# Patient Record
Sex: Female | Born: 1965 | Hispanic: No | Marital: Married | Smoking: Never smoker
Health system: Southern US, Community
[De-identification: ages and names within clinical notes are randomized; demographics above are authoritative.]

## PROBLEM LIST (undated history)

## (undated) DIAGNOSIS — D219 Benign neoplasm of connective and other soft tissue, unspecified: Secondary | ICD-10-CM

## (undated) HISTORY — PX: ABDOMINAL HYSTERECTOMY: SHX81

## (undated) HISTORY — DX: Benign neoplasm of connective and other soft tissue, unspecified: D21.9

## (undated) HISTORY — PX: KNEE SURGERY: SHX244

## (undated) HISTORY — PX: ANKLE FRACTURE SURGERY: SHX122

## (undated) HISTORY — PX: FOOT SURGERY: SHX648

---

## 1997-11-08 ENCOUNTER — Inpatient Hospital Stay (HOSPITAL_COMMUNITY): Admission: AD | Admit: 1997-11-08 | Discharge: 1997-11-08 | Payer: Self-pay | Admitting: Obstetrics & Gynecology

## 2000-11-01 ENCOUNTER — Ambulatory Visit (HOSPITAL_COMMUNITY): Admission: RE | Admit: 2000-11-01 | Discharge: 2000-11-01 | Payer: Self-pay | Admitting: Pulmonary Disease

## 2000-11-01 ENCOUNTER — Encounter: Payer: Self-pay | Admitting: Pulmonary Disease

## 2009-10-18 ENCOUNTER — Emergency Department (HOSPITAL_BASED_OUTPATIENT_CLINIC_OR_DEPARTMENT_OTHER): Admission: EM | Admit: 2009-10-18 | Discharge: 2009-10-18 | Payer: Self-pay | Admitting: Emergency Medicine

## 2009-10-18 ENCOUNTER — Ambulatory Visit: Payer: Self-pay | Admitting: Diagnostic Radiology

## 2009-10-26 ENCOUNTER — Ambulatory Visit (HOSPITAL_COMMUNITY): Admission: RE | Admit: 2009-10-26 | Discharge: 2009-10-27 | Payer: Self-pay | Admitting: Orthopaedic Surgery

## 2010-09-14 LAB — BASIC METABOLIC PANEL
BUN: 10 mg/dL (ref 6–23)
BUN: 8 mg/dL (ref 6–23)
CO2: 24 mEq/L (ref 19–32)
CO2: 26 mEq/L (ref 19–32)
Calcium: 9.1 mg/dL (ref 8.4–10.5)
Calcium: 9.1 mg/dL (ref 8.4–10.5)
Chloride: 107 mEq/L (ref 96–112)
Chloride: 104 mEq/L (ref 96–112)
Creatinine, Ser: 0.65 mg/dL (ref 0.4–1.2)
Creatinine, Ser: 0.6 mg/dL (ref 0.4–1.2)
GFR calc Af Amer: >60 mL/min (ref >60)
GFR calc Af Amer: >60 mL/min (ref >60)
GFR calc non Af Amer: >60 mL/min (ref >60)
GFR calc non Af Amer: >60 mL/min (ref >60)
Glucose, Bld: 95 mg/dL (ref 70–99)
Glucose, Bld: 103 mg/dL — ABNORMAL HIGH (ref 70–99)
Potassium: 4.3 mEq/L (ref 3.5–5.1)
Potassium: 4.2 mEq/L (ref 3.5–5.1)
Sodium: 137 mEq/L (ref 135–145)
Sodium: 133 mEq/L — ABNORMAL LOW (ref 135–145)

## 2010-09-14 LAB — CBC
HCT: 34.4 % — ABNORMAL LOW (ref 36.0–46.0)
Hemoglobin: 12.1 g/dL (ref 12.0–15.0)
MCHC: 35.2 g/dL (ref 30.0–36.0)
MCV: 89.1 fL (ref 78.0–100.0)
Platelets: 253 10*3/uL (ref 150–400)
RBC: 3.86 MIL/uL — ABNORMAL LOW (ref 3.87–5.11)
RDW: 13.3 % (ref 11.5–15.5)
WBC: 9.1 10*3/uL (ref 4.0–10.5)

## 2014-02-17 ENCOUNTER — Ambulatory Visit (INDEPENDENT_AMBULATORY_CARE_PROVIDER_SITE_OTHER): Payer: Self-pay | Admitting: Emergency Medicine

## 2014-02-17 VITALS — BP 122/78 | HR 92 | Temp 98.4°F | Resp 20 | Ht 62.0 in | Wt 181.1 lb

## 2014-02-17 DIAGNOSIS — R1310 Dysphagia, unspecified: Secondary | ICD-10-CM

## 2014-02-17 NOTE — Progress Notes (Signed)
[facility] [address], [place] [address] [phone]  Date:  02/17/2014   Name:  Jeanette Cooper   DOB:  May 15, 1966   MRN:  [id number]  PCP:  No PCP Per Patient    Chief Complaint: Trouble Swallowing and Dry Mouth   History of Present Illness:  Jeanette Cooper is a 48 y.o. 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 Has been present for years.  Has been treated with "shots for a year" with no improvement.  No fever but chills.  Says she cannot eat.  Hurts to swallow.   No nausea or vomiting.  No cough or coryza.  No wheezing or shortness of breath.  No rash No improvement with over the counter medications or other home remedies.  Denies other complaint or health concern today.   There are no active problems to display for this patient.   No past medical history on file.  No past surgical history on file.  History  Substance Use Topics  . Smoking status: Never Smoker   . Smokeless tobacco: Never Used  . Alcohol Use: No    No family history on file.  Allergies not on file  Medication list has been reviewed and updated.  No current outpatient prescriptions on file prior to visit.   No current facility-administered medications on file prior to visit.    Review of Systems:  As per HPI, otherwise negative.    Physical Examination: Filed Vitals:   02/17/14 2102  BP: 122/78  Pulse: 92  Temp: 98.4 F (36.9 C)  Resp: 20   Filed Vitals:   02/17/14 2102  Height: 5\' 2"  (1.575 m)  Weight: 181 lb 2 oz (82.158 kg)   Body mass index is 33.12 kg/(m^2). Ideal Body Weight: Weight in (lb) to have BMI = 25: 136.4  GEN: WDWN, NAD, Non-toxic, A & O x 3 HEENT: Atraumatic, Normocephalic. Neck supple. No masses, No LAD. Ears and Nose: No external deformity. CV: RRR, No M/G/R. No JVD. No thrill. No extra heart sounds. PULM: CTA B, no wheezes, crackles,  rhonchi. No retractions. No resp. distress. No accessory muscle use. ABD: S, NT, ND, +BS. No rebound. No HSM. EXTR: No c/c/e NEURO Normal gait.  PSYCH: Normally interactive. Conversant. Not depressed or anxious appearing.  Calm demeanor.    Assessment and Plan: Pharyngitis ENT consult  Signed,  Ellison Carwin, MD

## 2014-02-17 NOTE — Patient Instructions (Signed)
Disfagia (Dysphagia) La dificultad para tragar (disfagia) ocurre cuando los slidos y los lquidos parecen adherirse a Patent examiner en su paso hacia el [place], o los alimentos demoran mucho en llegar al [employer]. Otros sntomas son regurgitacin de la comida, ruidos que provienen de la garganta, molestias en el pecho al tragar y sensacin de plenitud o de que hay algo adherido en la garganta al tragar. Cuando la obstruccin en la garganta es completa puede asociarse a babeo. CAUSAS  Los problemas para tragar pueden ocurrir debido a [employer]. Los alimentos no pueden ser [employer] de la manera habitual hacia el [place]. Puede tener lceras, tejido cicatrizal o inflamacin en el tubo por el que los alimentos descienden desde la boca al estmago (esfago), lo que obstruye a los alimentos en su paso normal hacia el [place]. Las causas de la inflamacin incluyen:  Reflujo cido desde el estmago hacia el esfago.  Infeccin.  Tratamiento de radiacin para Science writer.  Medicamentos que se toman sin la cantidad suficiente de lquido para IT sales professional. Puede ser que tenga problemas neurolgicos que impidan que las seales se enven a los msculos del esfago para que se contraigan y [place] la comida [place], [place]. Un globo farngeo es un problema relativamente frecuente en el que hay una sensacin de obstruccin o dificultad para tragar, sin que haya ninguna anormalidad fsica en los conductos que intervienen en la deglucin. Este problema generalmente mejora con el tiempo, al reasegurar y [place] estudios para descartar otras causas. DIAGNSTICO La disfagia puede diagnosticarse y su causa puede determinarse con estudios en los que deber tragar una sustancia blanca que ayuda visualizar el interior de la garganta (medio de contraste) mientras se toman radiografas. En algunos casos se inserta un telescopio flexible (endoscopio) para observar el esfago y Copywriter, advertising. TRATAMIENTO   Si la causa de la disfagia es el reflujo cido o una infeccin podrn indicarle medicamentos.  Si la causa de la disfagia son problemas en los msculos que intervienen en la deglucin, ser necesario seguir una terapia para fortalecer estos msculos.  Si la causa es una obstruccin o un bulto, se realizarn procedimientos para remover la obstruccin. INSTRUCCIONES PARA EL CUIDADO EN EL HOGAR  Trate de consumir alimentos que sean fciles de tragar y trate de controlar su peso diariamente para asegurarse de que no baje demasiado.  Asegrese de beber lquidos mientras se encuentre sentado erguido (no acostado). SOLICITE ATENCIN MDICA SI:  Pierde peso debido a que no puede tragar.  Tose al beber lquidos (aspiracin).  Tose y elimina comida parcialmente digerida. SOLICITE ATENCIN MDICA DE INMEDIATO SI:  No puede tragar su propia saliva.  Tiene dificultad para respirar, tiene fiebre o ambos.    Tiene ronquera junto a la dificultad para tragar. ASEGRESE DE QUE:  Comprende estas instrucciones.  Controlar su afeccin.  Recibir ayuda de inmediato si no mejora o si empeora. Document Released: 03/23/2005 Document Revised: 02/13/2013 [facility] Patient Information 2015 [place]. This information is not intended to replace advice given to you by your health care provider. Make sure you discuss any questions you have with your health care provider.

## 2014-02-18 ENCOUNTER — Emergency Department (HOSPITAL_BASED_OUTPATIENT_CLINIC_OR_DEPARTMENT_OTHER): Payer: Worker's Compensation

## 2014-02-18 ENCOUNTER — Encounter (HOSPITAL_BASED_OUTPATIENT_CLINIC_OR_DEPARTMENT_OTHER): Payer: Self-pay | Admitting: Emergency Medicine

## 2014-02-18 ENCOUNTER — Emergency Department (HOSPITAL_BASED_OUTPATIENT_CLINIC_OR_DEPARTMENT_OTHER)
Admission: EM | Admit: 2014-02-18 | Discharge: 2014-02-18 | Disposition: A | Discharge status: Home / Self Care | Payer: Worker's Compensation | Attending: Emergency Medicine | Admitting: Emergency Medicine

## 2014-02-18 ENCOUNTER — Telehealth: Payer: Self-pay

## 2014-02-18 DIAGNOSIS — Y929 Unspecified place or not applicable: Secondary | ICD-10-CM | POA: Insufficient documentation

## 2014-02-18 DIAGNOSIS — W11XXXA Fall on and from ladder, initial encounter: Secondary | ICD-10-CM | POA: Insufficient documentation

## 2014-02-18 DIAGNOSIS — S99919A Unspecified injury of unspecified ankle, initial encounter: Secondary | ICD-10-CM

## 2014-02-18 DIAGNOSIS — M25469 Effusion, unspecified knee: Secondary | ICD-10-CM | POA: Insufficient documentation

## 2014-02-18 DIAGNOSIS — S8990XA Unspecified injury of unspecified lower leg, initial encounter: Secondary | ICD-10-CM | POA: Insufficient documentation

## 2014-02-18 DIAGNOSIS — S8000XA Contusion of unspecified knee, initial encounter: Secondary | ICD-10-CM | POA: Diagnosis not present

## 2014-02-18 DIAGNOSIS — S99929A Unspecified injury of unspecified foot, initial encounter: Secondary | ICD-10-CM | POA: Diagnosis present

## 2014-02-18 DIAGNOSIS — Y939 Activity, unspecified: Secondary | ICD-10-CM | POA: Diagnosis not present

## 2014-02-18 DIAGNOSIS — S82899A Other fracture of unspecified lower leg, initial encounter for closed fracture: Secondary | ICD-10-CM | POA: Insufficient documentation

## 2014-02-18 DIAGNOSIS — S82891A Other fracture of right lower leg, initial encounter for closed fracture: Secondary | ICD-10-CM

## 2014-02-18 DIAGNOSIS — M25462 Effusion, left knee: Secondary | ICD-10-CM

## 2014-02-18 MED ORDER — ONDANSETRON HCL 4 MG PO TABS: 4.0000 mg | ORAL_TABLET | Freq: Four times a day (QID) | ORAL | Status: DC

## 2014-02-18 MED ORDER — ONDANSETRON HCL 8 MG PO TABS: 4.0000 mg | ORAL_TABLET | Freq: Once | ORAL | Status: DC

## 2014-02-18 MED ORDER — ONDANSETRON 4 MG PO TBDP
4.0000 mg | ORAL_TABLET | Freq: Once | ORAL | Status: AC
  Administered 2014-02-18: 4 mg via ORAL
  Filled 2014-02-18: qty 1

## 2014-02-18 MED ORDER — OXYCODONE-ACETAMINOPHEN 5-325 MG PO TABS
1.0000 | ORAL_TABLET | Freq: Once | ORAL | Status: AC
  Administered 2014-02-18: 2 via ORAL
  Filled 2014-02-18: qty 2

## 2014-02-18 MED ORDER — OXYCODONE-ACETAMINOPHEN 5-325 MG PO TABS: 1.0000 | ORAL_TABLET | Freq: Four times a day (QID) | ORAL | Status: DC | PRN

## 2014-02-18 NOTE — Telephone Encounter (Signed)
Patient is now requesting antibiotics that she had turned down from Dr. Ouida Sills from her visit yesterday- she thought she was leaving to [place] to get cheaper rx and to see her own doctor in [place]. She says she is feeling worse and thought that she would be seen today by an ENT dr. She is now requesting the medication Dr. Ouida Sills was going to prescribe. Please advise.   Pharmacy: [id number] [place], [place]

## 2014-02-18 NOTE — ED Notes (Signed)
Pt. Was on a 70ft ladder and fell down the ladder while cleaning a high window.  Pt. Reports R ankle and R foot pain ( pt. Reports breaking the R ankle three yrs ago).  Pt. Has reports of the L knee hurting.  Pt. Also complains of of Back pain.

## 2014-02-18 NOTE — Discharge Instructions (Signed)
Please follow the instructions included.  You have a small, fracture on you rt ankle that looks new, but it is not out of place and you can bear weight on that foot.  You do not have a fracture in your knee but there is fluid on your knee that may mean there is an injury to the other structures in your knee.  It is very important to follow up with your ortho (bone) doctor this week.  You have been given prescriptions for bad pain, but you may also take ibuprofen 600 mg every 6-8 hrs for less pain. A knee immobilizer was placed to rest and protect your injury. Try to keep your splint clean and dry.   Call or return immediately if you have: Increased pain or pressure around the injury.  Numbness, tingling, painful, cool toes or fingers.  Swelling or inability to move fingers or toes.  Color change to fingers or toes (blue or gray).

## 2014-02-18 NOTE — Telephone Encounter (Signed)
Pt was referred to ENT at [place]. No mention of prescribing medication for dysphagia. Please advise.

## 2014-02-18 NOTE — ED Notes (Signed)
Pt reports still feeling dizzy but nausea has passed since she is drinking Ginger Ale.

## 2014-02-18 NOTE — ED Provider Notes (Signed)
CSN: [id number]     Arrival date & time 02/18/14  1534 History   First MD Initiated Contact with Patient 02/18/14 1553     Chief Complaint  Patient presents with  . Fall   Patient is a 48 y.o. female presenting with fall. The history is provided by a relative. The history is limited by a language barrier. A language interpreter was used.  Fall This is a new problem. Episode onset: Just PTA. Associated symptoms include arthralgias, joint swelling and myalgias. Pertinent negatives include no chest pain, chills, coughing, fever, headaches, nausea, neck pain, numbness, rash, sore throat, vomiting or weakness.   Jeanette Cooper is 48 yo female with PMH of ankle surgery, presenting to the ED with c/o rt ankle, and bilat knee pain since fall, 1 hour prior to arrival. She was near the top of a 20 foot ladder, the ladder fell over and she came down with the ladder, landing on her right side.  She reports not being able to bear weight on her left leg.  She denies hitting her head, syncopal episodes, n/v, neck pain, numbness, tingling or alterartons in sensation.    History reviewed. No pertinent past medical history. Past Surgical History  Procedure Laterality Date  . Ankle fracture surgery     No family history on file. History  Substance Use Topics  . Smoking status: Never Smoker   . Smokeless tobacco: Never Used  . Alcohol Use: No   OB History   Grav Para Term Preterm Abortions TAB SAB Ect Mult Living                 Review of Systems  Constitutional: Negative for fever and chills.  HENT: Negative for sore throat.   Eyes: Negative for visual disturbance.  Respiratory: Negative for cough and shortness of breath.   Cardiovascular: Negative for chest pain and leg swelling.  Gastrointestinal: Negative for nausea, vomiting and diarrhea.  Genitourinary: Negative for dysuria.  Musculoskeletal: Positive for arthralgias, gait problem, joint swelling and myalgias. Negative for neck pain and neck  stiffness.  Skin: Negative for rash.  Neurological: Negative for syncope, weakness, numbness and headaches.      Allergies  Hydrocodone  Home Medications   Prior to Admission medications   Not on File   BP 128/71  Pulse 82  Temp(Src) 98.4 F (36.9 C) (Oral)  Resp 17  Ht 5\' 2"  (1.575 m)  Wt 180 lb (81.647 kg)  BMI 32.91 kg/m2  SpO2 99% Physical Exam  Nursing note and vitals reviewed. Constitutional: She is oriented to person, place, and time. She appears well-developed and well-nourished. No distress.  HENT:  Head: Normocephalic and atraumatic.  Mouth/Throat: Oropharynx is clear and moist. No oropharyngeal exudate.  Eyes: Conjunctivae are normal.  Neck: Neck supple. No thyromegaly present.  Cardiovascular: Normal rate, regular rhythm and intact distal pulses.   Pulmonary/Chest: Effort normal and breath sounds normal. No respiratory distress. She has no wheezes. She has no rales. She exhibits no tenderness.  Abdominal: Soft. There is no tenderness.  Musculoskeletal: She exhibits tenderness.       Right knee: She exhibits swelling and ecchymosis. She exhibits normal range of motion. Tenderness found.       Left knee: She exhibits decreased range of motion and swelling. She exhibits no LCL laxity, no bony tenderness and no MCL laxity. Tenderness found.       Right ankle: She exhibits normal range of motion, no swelling, no deformity and normal pulse. Tenderness.  Lymphadenopathy:    She has no cervical adenopathy.  Neurological: She is alert and oriented to person, place, and time. No cranial nerve deficit or sensory deficit. Coordination normal. GCS eye subscore is 4. GCS verbal subscore is 5. GCS motor subscore is 6.  Skin: Skin is warm and dry. Ecchymosis noted. No rash noted. She is not diaphoretic.     Psychiatric: She has a normal mood and affect.    ED Course  Procedures (including critical care time) Labs Review Labs Reviewed - No data to display  Imaging  Review Dg Ankle Complete Right  02/18/2014   CLINICAL DATA:  [place], pain  EXAM: RIGHT ANKLE - COMPLETE 3+ VIEW  COMPARISON:  10/18/2009  FINDINGS: The patient has undergone previous repair of a bimalleolar fracture. The fractures have healed. The hardware remains. There is slight widening of the ankle mortise medially which could be acute or chronic. There may be an acute injury of the medial malleolus with a slight oblique lucency below the hardware (arrow). There is a subcentimeter sized inframalleolar osseous density on the LEFT which is probably chronic, unlikely to represent an acute avulsion fragment. The talus appears intact. There is no calcaneal fracture. Mild anterior and medial soft tissue swelling.  IMPRESSION: Slight widening of the ankle mortise medially which could be acute or chronic. Remote ORIF ankle fractures.  Possible acute injury of the medial malleolus seen best on the oblique view, arrow.  Subcentimeter sized inframalleolar osseous density on the LEFT which is probably chronic.   Electronically Signed   By: Rolla Flatten M.D.   On: 02/18/2014 16:51   Dg Knee Complete 4 Views Left  02/18/2014   CLINICAL DATA:  [place] approximately 20 feet from a ladder earlier today, injuring the left knee.  EXAM: LEFT KNEE - COMPLETE 4+ VIEW  COMPARISON:  None.  FINDINGS: No evidence of acute fracture or dislocation. Well-preserved joint spaces. Well-preserved bone mineral density. No intrinsic osseous abnormality. Small to moderate-sized joint effusion.  IMPRESSION: No osseous abnormality.  Small to moderate-sized joint effusion.   Electronically Signed   By: Evangeline Dakin M.D.   On: 02/18/2014 16:48   Dg Knee Complete 4 Views Right  02/18/2014   CLINICAL DATA:  Fall, right knee pain/injury  EXAM: RIGHT KNEE - COMPLETE 4+ VIEW  COMPARISON:  None.  FINDINGS: No fracture or dislocation is seen.  The joint spaces are preserved.  The visualized soft tissues are unremarkable.  IMPRESSION: No fracture or  dislocation is seen.   Electronically Signed   By: Julian Hy M.D.   On: 02/18/2014 16:50     EKG Interpretation None      MDM   Final diagnoses:  Fall from ladder, initial encounter  Knee effusion, left  Closed right ankle fracture, initial encounter   Pt is 48 yo female presents after fall from ladder, when ladder fell.  X-Ray shows left knee effusion and small non-displaced Rt malleolus fracture.  Pain managed in ED. Pt is able to bear weight on rt leg but is non weight bearing on left.  Lt knee immobilizer placed and crutches given. Pt advised to follow up with orthopedics. Conservative therapy recommended and discussed. Patient will be dc home & is agreeable with above plan.  Return precautions given.   Meds given in ED:  Medications  oxyCODONE-acetaminophen (PERCOCET/ROXICET) 5-325 MG per tablet 1-2 tablet (2 tablets Oral Given 02/18/14 1623)  ondansetron (ZOFRAN-ODT) disintegrating tablet 4 mg (4 mg Oral Given 02/18/14 1623)  Discharge Medication List as of 02/18/2014  6:09 PM         Britt Bottom, NP 02/18/14 1859

## 2014-02-18 NOTE — ED Notes (Signed)
Pt rx x 2 were picked up by [facility] pharmacy-now closed-EDPA notified and rx x 2 reprinted-also request to have d/c instructions in spanish-done

## 2014-02-19 NOTE — Telephone Encounter (Signed)
No just have her see the ENT

## 2014-02-19 NOTE — ED Provider Notes (Signed)
Medical screening examination/treatment/procedure(s) were performed by non-physician practitioner and as supervising physician I was immediately available for consultation/collaboration.   EKG Interpretation None        Fredia Sorrow, MD 02/19/14 1615

## 2014-02-19 NOTE — Telephone Encounter (Signed)
Pt understands no medication and will be going to a specialist.

## 2016-03-17 ENCOUNTER — Encounter: Payer: Self-pay | Admitting: Gynecology

## 2016-03-17 ENCOUNTER — Ambulatory Visit (INDEPENDENT_AMBULATORY_CARE_PROVIDER_SITE_OTHER): Payer: BLUE CROSS/BLUE SHIELD | Admitting: Gynecology

## 2016-03-17 VITALS — BP 116/78 | Ht 61.5 in | Wt 181.0 lb

## 2016-03-17 DIAGNOSIS — R102 Pelvic and perineal pain: Secondary | ICD-10-CM | POA: Diagnosis not present

## 2016-03-17 DIAGNOSIS — R232 Flushing: Secondary | ICD-10-CM

## 2016-03-17 DIAGNOSIS — Z01419 Encounter for gynecological examination (general) (routine) without abnormal findings: Secondary | ICD-10-CM | POA: Diagnosis not present

## 2016-03-17 DIAGNOSIS — N951 Menopausal and female climacteric states: Secondary | ICD-10-CM

## 2016-03-17 NOTE — Addendum Note (Signed)
Addended by: Thurnell Garbe A on: 03/17/2016 12:04 PM   Modules accepted: Orders

## 2016-03-17 NOTE — Progress Notes (Signed)
Jeanette Cooper 20-Oct-1965 [id number]   History:    50 y.o.  for annual gyn exam who is a new patient to the practice with the following complaints: Hot flashes, irritability, mood swing, vaginal dryness, and decreased libido. Patient stated many years ago in [place] she had total abdominal hysterectomy for fibroid uterus. She stated she had her recent blood work and mammogram done in [place] this year. She stated that her Pap smear and they did a colposcopy we have no report. Asian had someone else look at her Pap smear in [place] and she did not need a colposcopic evaluation or treatment. For this reason were going to repeat a Pap smear today. Patient also requesting a flu vaccine.   Patient with complaints also vault, discomfort on and off but no true dyspareunia.   Past medical history,surgical history, family history and social history were all reviewed and documented in the EPIC chart.  Gynecologic History No LMP recorded. Patient has had a hysterectomy. Contraception: status post hysterectomy Last Pap: In [place] 2017. Results were: Uncertain result? We have no report? Last mammogram: Done in [place] patient reported was normal. Results were: 2017  Obstetric History OB History  Gravida Para Term Preterm AB Living  3 3       3   SAB TAB Ectopic Multiple Live Births               # Outcome Date GA Lbr Len/2nd Weight Sex Delivery Anes PTL Lv  3 Para      Vag-Spont     2 Para      Vag-Spont     1 Para      Vag-Spont          ROS: A ROS was performed and pertinent positives and negatives are included in the history.  GENERAL: No fevers or chills. HEENT: No change in vision, no earache, sore throat or sinus congestion. NECK: No pain or stiffness. CARDIOVASCULAR: No chest pain or pressure. No palpitations. PULMONARY: No shortness of breath, cough or wheeze. GASTROINTESTINAL: No abdominal pain, nausea, vomiting or diarrhea, melena or bright red blood per rectum. GENITOURINARY: No  urinary frequency, urgency, hesitancy or dysuria. MUSCULOSKELETAL: No joint or muscle pain, no back pain, no recent trauma. DERMATOLOGIC: No rash, no itching, no lesions. ENDOCRINE: No polyuria, polydipsia, no heat or cold intolerance. No recent change in weight. HEMATOLOGICAL: No anemia or easy bruising or bleeding. NEUROLOGIC: No headache, seizures, numbness, tingling or weakness. PSYCHIATRIC: No depression, no loss of interest in normal activity or change in sleep pattern.     Exam: chaperone present  BP 116/78   Ht 5' 1.5" (1.562 m)   Wt 181 lb (82.1 kg)   BMI 33.65 kg/m   Body Cooper index is 33.65 kg/m.  General appearance : Well developed well nourished female. No acute distress HEENT: Eyes: no retinal hemorrhage or exudates,  Neck supple, trachea midline, no carotid bruits, no thyroidmegaly Lungs: Clear to auscultation, no rhonchi or wheezes, or rib retractions  Heart: Regular rate and rhythm, no murmurs or gallops Breast:Examined in sitting and supine position were symmetrical in appearance, no palpable masses or tenderness,  no skin retraction, no nipple inversion, no nipple discharge, no skin discoloration, no axillary or supraclavicular lymphadenopathy Abdomen: no palpable masses or tenderness, no rebound or guarding Extremities: no edema or skin discoloration or tenderness  Pelvic:  Bartholin, Urethra, Skene Glands: Within normal limits             Vagina:  No gross lesions or discharge  Cervix: Absent  Uterus  absent  Adnexa  Without masses or tenderness  Anus and perineum  normal   Rectovaginal  normal sphincter tone without palpated masses or tenderness             Hemoccult not indicated     Assessment/Plan:  50 y.o. female for annual exam will return back to the office in one week for pelvic ultrasound for better assessment of her ovaries. Although patient had a hysterectomy but there was uncertain interpretation of a Pap smear that was done this year in [place] I'm  going to repeat her Pap smear with HPV screening today. Also because of her vasomotor symptoms we're going to check her [facility]. She stated that her lipid profile and blood sugar was screening this year [place] was normal. Patient also requesting flu vaccine. Literature information Spanish on the menopause and hormone replacement therapy was provided.   Jeanette Mass MD, 11:44 AM 03/17/2016  Is a new patient to the practice. Patient with complaints

## 2016-03-17 NOTE — Patient Instructions (Signed)
Influenza Virus Vaccine (Flucelvax) Qu es este medicamento? La VACUNA ANTIGRIPAL ayuda a disminuir el riesgo de contraer la influenza, tambin conocida como la gripe. La vacuna solo ayuda a protegerle contra algunas cepas de influenza. Este medicamento puede ser utilizado para otros usos; si tiene alguna pregunta consulte con su proveedor de atencin mdica o con su farmacutico. Qu le debo informar a mi profesional de la salud antes de tomar este medicamento? Necesita saber si usted presenta alguno de los siguientes problemas o situaciones: -trastorno de sangrado como hemofilia -fiebre o infeccin -sndrome de Guillain-Barre u otros problemas neurolgicos -problemas del sistema inmunolgico -infeccin por el virus de la inmunodeficiencia humana (VIH) o SIDA -niveles bajos de plaquetas en la sangre -esclerosis mltiple -una reaccin alrgica o inusual a las vacunas antigripales, a otros medicamentos, alimentos, colorantes o conservantes -si est embarazada o buscando quedar embarazada -si est amamantando a un beb Cmo debo utilizar este medicamento? Esta vacuna se administra mediante inyeccin por va intramuscular. Lo administra un profesional de KB Home	[place]. Recibir una copia de informacin escrita sobre la vacuna antes de cada vacuna. Asegrese de leer este folleto cada vez cuidadosamente. Este folleto puede cambiar con frecuencia. Hable con su pediatra para informarse acerca del uso de este medicamento en nios. Puede requerir atencin especial. Sobredosis: Pngase en contacto inmediatamente con un centro toxicolgico o una sala de urgencia si usted cree que haya tomado demasiado medicamento. ATENCIN: [employer] es solo para usted. No comparta este medicamento con nadie. Qu sucede si me olvido de una dosis? No se aplica en este caso. Qu puede interactuar con este medicamento? -quimioterapia o radioterapia -medicamentos que suprimen el sistema inmunolgico, tales como  etanercept, anakinra, infliximab y adalimumab -medicamentos que tratan o previenen cogulos sanguneos, como warfarina -fenitona -medicamentos esteroideos, como la prednisona o la cortisona -teofilina -vacunas Puede ser que esta lista no menciona todas las posibles interacciones. Informe a su profesional de KB Home	[place] de [employer] productos a base de hierbas, medicamentos de [place] o suplementos nutritivos que est tomando. Si usted fuma, consume bebidas alcohlicas o si utiliza drogas ilegales, indqueselo tambin a su profesional de KB Home	[place]. Algunas sustancias pueden interactuar con su medicamento. A qu debo estar atento al usar [employer]? Informe a su mdico o a Barrister's clerk de la [employer] todos los efectos secundarios que persistan despus de 3 das. Llame a su proveedor de atencin mdica si se presentan sntomas inusuales dentro de las 6 semanas de recibir esta vacuna. Es posible que todava pueda contraer la gripe, pero la enfermedad no ser tan fuerte como normalmente. No puede contraer la gripe de esta vacuna. La vacuna antigripal no le protege contra resfros u otras enfermedades que pueden causar [place]. Debe vacunarse cada ao. Qu efectos secundarios puedo tener al [employer] este medicamento? Efectos secundarios que debe informar a su mdico o a Barrister's clerk de la salud tan pronto como sea posible: -reacciones alrgicas como erupcin cutnea, picazn o urticarias, hinchazn de la cara, labios o lengua Efectos secundarios que, por lo general, no requieren atencin mdica (debe informarlos a su mdico o a su profesional de la salud si persisten o si son molestos): -fiebre -dolor de cabeza -molestias y dolores musculares -dolor, sensibilidad, enrojecimiento o Estate agent de la inyeccin -cansancio Puede ser que esta lista no menciona todos los posibles efectos secundarios. Comunquese a su mdico por asesoramiento mdico [employer]. Usted  puede informar los efectos secundarios a la FDA por telfono al 1-800-FDA-1088. Dnde  debo guardar mi medicina? Esta vacuna se administrar por un profesional de la salud en una [place], Corporate investment banker, consultorio mdico u otro consultorio de un profesional de la salud. No se le suministrar esta vacuna para guardar en su domicilio. ATENCIN: Este folleto es un resumen. Puede ser que no cubra toda la posible informacin. Si usted tiene preguntas acerca de esta medicina, consulte con su mdico, su farmacutico o su profesional de Radiographer, therapeutic.    2016, Elsevier/Gold Standard. (2011-05-30 16:29:16) Luther Redo de reemplazo hormonal (Hormone Therapy) En la menopausia, su cuerpo comienza a producir menos estrgeno y Education officer, museum. Esto provoca que el cuerpo deje de tener perodos [place]. Esto se debe a que el estrgeno y la progesterona controlan sus perodos y su ciclo menstrual. Neomia Dear falta de estrgeno puede causar sntomas tales como:  Research scientist (life sciences).  Sequedad vaginal  Piel seca.  Prdida del deseo sexual.  Riesgo de prdida de hueso (osteoporosis). Cuando esto ocurre, puede elegir realizar Neomia Dear terapia hormonal para volver a Radiographer, therapeutic estrgeno perdido [employer]. Cuando slo se introduce esta hormona, el procedimiento se conoce normalmente como TRE (terapia de reemplazo de [place]). Cuando la hormona progestina se combina con el estrgeno, el procedimiento se conoce normalmente como TH (terapia hormonal). Esto es lo que previamente se conoca como terapia de reemplazo hormonal (TRH). El profesional que le asiste le ayudar a tomar una decisin acerca de lo que resulte lo mejor para usted. La decisin de realizar una TRH cambia a menudo debido a que se Engineer, agricultural. Muchos estudios no ponen de acuerdo con respecto a los beneficios de Education officer, environmental una terapia de reemplazo hormonal.  BENEFICIOS PROBABLES DE LA TRH QUE INCLUYEN PROTECCIN CONTRA:  Golpes de calor - Un golpe de calor es  la sensacin repentina de calor sobre la cara y el cuerpo. La piel enrojece, como al sonrojarse. Estn asociados con la transpiracin y los trastornos del sueo. Las mujeres que atraviesan la menopausia pueden tener golpes de calor unas pocas veces en el mes o varios al da; esto depende de la mujer.  Osteoporosis (prdida de hueso) - El estrgeno ayuda a protegerse contra la prdida de [place]. Luego de la [place], los huesos de una mujer pierden calcio y se vuelven frgiles y Air traffic controller. Como resultado, es ms probable que el hueso se [place]. Los que resultan afectados con mayor frecuencia son los de la cadera, la [place] y la columna vertebral. La terapia hormonal puede ayudar a retardar la prdida de hueso luego de la menopausia. Realizar ejercicios con peso y tomar calcio con vitamina D tambin puede ayudar a prevenir la prdida de [place]. Existen medicamentos que puede prescribir el profesional que la asiste para ayudar a prevenir la osteoporosis.  Sequedad vaginal - La prdida de estrgeno produce cambios en la vagina. El recubrimiento de la misma puede volverse fino y Nurse, learning disability. Estos cambios pueden causar dolor y sangrado durante las relaciones sexuales. La sequedad tambin puede producir una infeccin. Puede ocasionarle ardor y picazn.  Las infecciones en las vas urinarias son ms comunes luego de la menopausia debido a la falta de Astronomer.  Otros beneficios posibles del estrgeno incluyen un cambio positivo en el humor y en la memoria de corto plazo en las mujeres. EFECTOS SECUNDARIOS Y RIESGOS  Utilizar estrgeno slo sin progesterona causa que el recubrimiento del tero crezca. Esto aumenta el riesgo de cncer endometrial. El profesional que la asiste deber darle otra hormona llamada progestina, si usted tiene tero.  Las mujeres que [place]  combinada (estrgeno y progestina) parecen tener un mayor riesgo de sufrir cncer de mama. El riesgo parece ser [place], [place] aumenta a lo  largo del tiempo que se realice la [place].  La terapia combinada tambin hace que el tejido mamario sea levemente ms denso, lo que hace que sea ms difcil leer mamografas (radiografas de mama).  Combinada, la terapia de estrgeno y Immunologist puede realizarse todos los das, en cuyo caso podrn Warehouse manager de [place]. La TH puede realizarse de [place] cclica, en cuyo caso tendr perodos menstruales.  La TH puede aumentar el riesgo de [place], ataque cardaco, cncer de mama y formacin de cogulos en la pierna.  El estrgeno transdrmico (estrgeno que se absorbe a Designer, jewellery de la piel mediante el uso de un parche o una crema) puede tener mejores resultados en los siguientes casos:  Colesterol.  La presin arterial.  Cogulos sanguneos. La presencia de estas afecciones puede indicar que no debe realizarse terapia hormonal:  Cncer de endometrio.  Enfermedad heptica.  Cncer de mama.  Cardiopata.  Antecedentes de cogulos sanguneos.  Ictus. TRATAMIENTO  Si decide realizar Ardelia Mems TH y tiene tero, normalmente se prescribe el uso de estrgeno y progestina.  El profesional que la asiste le ayudar a decidir la mejor forma de [employer].  [employer] formas posibles de administracin de [place], se incluyen las siguientes:  Pldoras.  Parches.  Geles.  Aerosoles.  Crema, anillos y vulos vaginales con estrgeno.  Lo mejor es Chief Executive Officer dosis posible que pueda ayudarla con sus sntomas y tomarlos durante la menor cantidad de tiempo posible.  La terapia hormonal puede ayudar a Banker de los problemas (sntomas) que afectan a las mujeres durante la menopausia. Antes de tomar una decisin con respecto a la TH, converse con el profesional que la asiste acerca de qu es lo mejor para usted. Mantngase bien informada y sintase cmoda con sus decisiones. INSTRUCCIONES PARA EL CUIDADO DOMICILIARIO:  [place] indicaciones del profesional con respecto  a cmo Biomedical scientist.  Se realiza una prueba de Papanicolaou para detectar el cncer de cuello del tero.  El primer Papanicolaou debe realizarse a los 21 aos.  La prueba se repite cada 2 aos entre los 21 y los 29 aos.  Despus de los 30 aos, debe realizarse una prueba de Papanicolaou cada 3 aos siempre que los 3 estudios anteriores hayan sido normales.  Algunas mujeres presentan problemas mdicos que aumentan la probabilidad de Museum/gallery curator cncer de cuello del tero. Consulte a su mdico acerca de estos problemas. Es muy importante que le informe a su mdico si aparecen nuevos problemas poco despus de su ltimo Papanicolaou. En estos casos, el mdico podr indicar que se realice el Papanicolaou con ms frecuencia.  Las [employer] Company son las mismas para las mujeres que hayan recibido o no la vacuna para el VPH (virus del papiloma humano).  Si le han realizado una histerectoma por un problema que no era cncer o una afeccin que pudiera causar cncer, ya no necesitar un Papanicolaou. Sin embargo, aunque ya no necesite hacerse un Papanicolaou, es una buena idea hacerse un examen regularmente para asegurarse de que no haya otros problemas.  Si tiene entre 81 y 88 aos y ha tenido [employer] estudios de Papanicolaou en los ltimos 10 aos, ya no ser IT sales professional. Sin embargo, aunque ya no necesite hacerse un Papanicolaou, es una buena idea hacerse un examen regularmente para asegurarse de que no haya otros problemas.  Si ha recibido un tratamiento para Science writer cervical o una enfermedad que podra causar cncer, necesitar realizarse una prueba de Papanicolaou y controles durante al menos 48 aos de concluido el [place].  Si no se ha [place] [employer] con regularidad, Chief Executive Officer a [employer] factores de riesgo (como el tener un nuevo compaero sexual) para Teacher, adult education si es necesario que se los haga de [place].  Es posible que algunas  mujeres deban realizarse exmenes de deteccin con mayor frecuencia si presentan un alto riesgo de padecer cncer de cuello del tero.  Hgase controles de [place] regular, e incluya Papanicolau y [place]. SOLICITE ATENCIN MDICA DE INMEDIATO SI PRESENTA:  Hemorragia vaginal anormal.  Dolor o inflamacin en las piernas, falta de aliento o Tourist information centre manager.  Mareos o dolores de [place].  Protuberancias o cambios en sus mamas o axilas.  Pronunciacin inarticulada.  Debilidad o adormecimiento en los brazos o las piernas.  Dolor, ardor o sangrado al [employer].  Dolor abdominal.   Esta informacin no tiene como fin reemplazar el consejo del mdico. Asegrese de hacerle al mdico cualquier pregunta que tenga.   Document Released: 11/30/2007 Document Revised: 10/28/2014 Elsevier Interactive Patient Education 2016 [place] menopausia (Menopause) La menopausia es el perodo normal de la vida en el cual los perodos menstruales cesan por completo. Se completa cuando no se tienen 12 perodos menstruales consecutivos. Ocurre generalmente en mujeres entre los 40 y los 62 aos. Muy rara vez una mujer tiene la menopausia antes de los 40 [place]. En la menopausia, los ovarios dejan de producir las hormonas femeninas estrgeno y Immunologist. Esto puede causar sntomas indeseables y Technical brewer a Technical sales engineer. A veces los sntomas aparecen entre 4 y 5 aos antes de que comience la menopausia. No existe una relacin [employer] y:  [place] anticonceptivos orales.  La cantidad de hijos que tuvo.  La raza.  La edad en que comenzaron los perodos menstruales (menarca). Las mujeres que fuman mucho y las que son muy delgadas pueden desarrollar la menopausia a una edad ms temprana. CAUSAS  Los ovarios dejan de producir las hormonas femeninas estrgeno y Immunologist.  Otras causas son:  [place] en la que se extirpan ambos ovarios.  Los ovarios que dejan de funcionar sin un motivo  conocido.  Tumores de la glndula pituitaria en el cerebro.  Enfermedades que Continental Airlines ovarios y la produccin de hormonas.  Radioterapia en el abdomen o la pelvis.  Quimioterapia que afecte a los ovarios. SNTOMAS   Acaloramiento.  Sudoracin nocturna.  Disminucin del deseo sexual.  Sequedad vaginal y adelgazamiento de la vagina, lo que causa relaciones sexuales dolorosas.  Sequedad de la piel y aparicin de Glass blower/designer.  Dolores de [place].  Cansancio.  Irritabilidad.  Problemas de memoria.  Aumento de [place].  Infeccin de la vejiga.  Aparicin de vello en el rostro y [place].  Infertilidad. Los sntomas ms graves pueden incluir:  Prdida de masa sea osteoporosis) que favorece la rotura de huesos(fracturas).  Depresin.  Endurecimiento y Public librarian de las arterias (aterosclerosis) lo que puede causar infarto e ictus. DIAGNSTICO   El perodo menstrual no aparece por 12 meses seguidos.  Examen fsico.  Estudios hormonales de Herbalist. TRATAMIENTO  Hay muchas opciones de tratamiento y casi tantas dudas como opciones existen. La decisin de tratar o no los cambios que trae la menopausia es una decisin que realiza el profesional de acuerdo con cada Advertising copywriter. El Management consultant las opciones de [place]  con usted. Juntos podrn decidir qu tratamiento es el mejor para usted. Las opciones de tratamiento pueden incluir:   Terapia hormonal (estrgenos y Immunologist).  Medicamentos no hormonales.  Tratamiento de los sntomas individuales con medicamentos (por ejemplo antidepresivos para la depresin).  Algunos medicamentos herbales pueden ayudar en sntomas especficos.  Psicoterapia con un psiclogo o psiquiatra.  Terapia grupal.  Cambios en el estilo de vida, como:  Consumir una dieta saludable.  Actividad fsica regular.  Limitar el consumo de cafena y alcohol.  Control del estrs y Public affairs consultant.  No realizar  tratamiento. [place] todos los medicamentos como el mdico le indique.  Descanse y duerma lo suficiente.  Haga ejercicios regularmente.  Consuma una dieta que contenga calcio (bueno para los [place]) y productos derivados de la soja (actan como estrgenos).  Evite las bebidas alcohlicas.  No fume.  Si tiene sofocones, vstase en capas.  Tome suplementos de calcio y vitamina D para fortalecer los [place].  Puede utilizar lubricantes y humectantes de venta libre para la sequedad vaginal.  En algunos casos la terapia de grupo podr ayudarla.  La acupuntura puede ser de ayuda en ciertos casos. SOLICITE ATENCIN MDICA SI:   No est segura de estar en la menopausia.  Tiene sntomas de [place] y [place] asesoramiento y Clinical research associate.  Tiene 34 aos y an tiene Chartered certified accountant.  Tiene dolor durante las [employer].  La menopausia se ha completado (no ha tenido el perodo menstrual durante 12 meses) y tiene un sangrado vaginal.  Necesita ser derivada a un especialista (gineclogo, psiquiatra, o psiclogo) para Arts administrator. SOLICITE ATENCIN MDICA DE INMEDIATO SI:   Siente una depresin intensa e incontrolable.  Tiene una hemorragia vaginal abundante.  Siente y cree que se ha fracturado un hueso.  Siente dolor al [employer] Airlines.  Siente dolor en el pecho o en la pierna.  Tiene latidos cardacos rpidos (palpitaciones).  Siente dolor de cabeza intenso.  Tiene problemas de visin.  Siente un bulto en la mama.  Siente un dolor abdominal intenso o indigestin.   Esta informacin no tiene Marine scientist el consejo del mdico. Asegrese de hacerle al mdico cualquier pregunta que tenga.   Document Released: 07/25/2006 Document Revised: 02/13/2013 Elsevier Interactive Patient Education [employer].

## 2016-03-18 LAB — PAP, TP IMAGING W/ HPV RNA, RFLX HPV TYPE 16,18/45: HPV MRNA, HIGH RISK: NOT DETECTED

## 2016-03-18 LAB — FOLLICLE STIMULATING HORMONE: FSH: 66.2 m[IU]/mL

## 2016-04-01 ENCOUNTER — Ambulatory Visit (INDEPENDENT_AMBULATORY_CARE_PROVIDER_SITE_OTHER): Payer: BLUE CROSS/BLUE SHIELD | Admitting: Gynecology

## 2016-04-01 ENCOUNTER — Encounter: Payer: Self-pay | Admitting: Gynecology

## 2016-04-01 ENCOUNTER — Ambulatory Visit (INDEPENDENT_AMBULATORY_CARE_PROVIDER_SITE_OTHER): Payer: BLUE CROSS/BLUE SHIELD

## 2016-04-01 VITALS — BP 120/78

## 2016-04-01 DIAGNOSIS — R6882 Decreased libido: Secondary | ICD-10-CM | POA: Diagnosis not present

## 2016-04-01 DIAGNOSIS — F39 Unspecified mood [affective] disorder: Secondary | ICD-10-CM | POA: Diagnosis not present

## 2016-04-01 DIAGNOSIS — R454 Irritability and anger: Secondary | ICD-10-CM

## 2016-04-01 DIAGNOSIS — R102 Pelvic and perineal pain: Secondary | ICD-10-CM | POA: Diagnosis not present

## 2016-04-01 DIAGNOSIS — R4586 Emotional lability: Secondary | ICD-10-CM

## 2016-04-01 DIAGNOSIS — Z79890 Hormone replacement therapy (postmenopausal): Secondary | ICD-10-CM

## 2016-04-01 DIAGNOSIS — N951 Menopausal and female climacteric states: Secondary | ICD-10-CM

## 2016-04-01 MED ORDER — ESTRADIOL 0.05 MG/24HR TD PTTW: 1.0000 | MEDICATED_PATCH | TRANSDERMAL | 12 refills | Status: DC

## 2016-04-01 NOTE — Progress Notes (Signed)
   Patient is a 50 year old that presented to the office today for follow-up ultrasound. Patient was seen as a new patient for her annual exam back on September 21. The ultrasound was ordered to the patient had some guarding and were not able to assess her ovaries. She did have an abdominal hysterectomy many years ago in [place] secondary to fibroids. She stated she had her mammogram this year Nexium was normal. They had also done her blood work. Patient been complaining of hot flashes, irritability, mood swing, insomnia, and decreased libido. We had obtained an [facility] which was found to be elevated at 66.2. Her Pap smear done here in the office recently was normal as well.  Ultrasound today: Absent uterus. Right and left ovary were normal otherwise. Small ovarian follicles 4 mm were noted left ovary. No fluid in the cul-de-sac nor adnexal masses.  Assessment/plan: 50 year old patient menopausal. We had a lengthy discussion of the women's health initiative study as well as the new guidelines her hormonal replacement therapy we discussed potential risk of DVT, pulmonary embolism and breast cancer. At provided her previously with literature information Spanish. We are going to start her with a low dose estrogen patch Vivelle 0.05 mg to apply twice weekly. We'll monitor her symptoms after 3 months no improvement we'll increase it to the 0.075 mg patch.  Gravida 90% time was spent counseling coordinate care for this patient. Time of consultation 15 minutes

## 2016-04-01 NOTE — Patient Instructions (Signed)
Estradiol skin patches Qu es este medicamento? Los parches de ESTRADIOL contienen un estrgeno. Se utiliza como terapia de reemplazo hormonal en mujeres que estn experimentando la menopausia. Ayuda a tratar los sofocos y a prevenir la osteoporosis. Se utiliza tambin para tratar a mujeres con bajos niveles de estrgeno o aquellas que le han extirpado sus ovarios. Este medicamento puede ser utilizado para otros usos; si tiene alguna pregunta consulte con su proveedor de atencin mdica o con su farmacutico. Qu le debo informar a mi profesional de la salud antes de tomar este medicamento? Necesita saber si usted presenta alguno de los siguientes problemas o situaciones: -sangrado vaginal anormal -enfermedad vascular o cogulos sanguneos -cncer de mama, cervical, endometrio, ovario, hgado o uterino -demencia -diabetes -enfermedad de la vescula biliar -enfermedad cardiaca o ataque cardiaco reciente -alta presin sangunea -niveles elevados de colesterol -nivel elevado de calcio en la sangre -histerectoma -enfermedad renal -enfermedad heptica -migraas -deficiencia de protena C -deficiencia de protena S -derrame cerebral -lupus eritematoso sistmico (LES) -fuma tabaco -reaccin alrgica o inusual a los estrgenos, otras hormonas, otros medicamentos, alimentos, colorantes o conservantes -si est embarazada o buscando quedar embarazada -si est amamantando a un beb Cmo debo [employer]? Este medicamento es slo para uso externo. Siga las instrucciones de la etiqueta del [place]. Abra la bolsa sin usar tijeras. Quite la cubierta rgida que protege al [place]. Trate de no tocar [employer]. Aplique el parche con el lado con pegamento hacia la piel, en una zona del cuerpo que est limpia, seca y sin vello. Evite las zonas con lesiones, irritaciones, callosidades o cicatrices. No aplique los parches en las mamas ni alrededor de Science writer. Utilice un lugar  distinto en cada ocasin para evitar la irritacin de la piel. No corte ni recorte el parche. No interrumpa su uso excepto si as lo indica su mdico o su profesional de [employer]	[place]. No use ms de un parche por vez a menos que su mdico o su profesional de la salud indique lo contrario. Hable con su pediatra para informarse acerca del uso de este medicamento en nios. Puede requerir atencin especial. Usted recibir un prospecto para el paciente para este producto con cada receta y relleno. Asegrese de leer este folleto cada vez cuidadosamente. Este folleto puede cambiar con frecuencia. Sobredosis: Pngase en contacto inmediatamente con un centro toxicolgico o una sala de urgencia si usted cree que haya tomado demasiado medicamento. ATENCIN: [employer] es solo para usted. No comparta este medicamento con nadie. Qu sucede si me olvido de una dosis? Si olvida una dosis, aplquela lo antes posible. Si es casi la hora de la prxima dosis, aplique slo esa dosis. No use dosis adicionales o dobles. Qu puede interactuar con este medicamento? No tome esta medicina con ninguno de los siguientes medicamentos: -inhibidores de Hydrologist, tales como aminoglutetimida, anastrozol, exemestno, letrozol, testolactona Esta medicina tambin puede interactuar con los siguientes medicamentos: -carbamazepina -ciertos antibiticos utilizados para tratar infecciones -ciertos barbitricos usados para inducir el sueo o tratar las convulsiones -jugo de toronja -medicamentos para infecciones micticas, tales como itraconazol y Arboriculturist -raloxifeno o tamoxifeno -rifabutina, rifampicina o rifapentina -ritonavir -hierba de [place] Puede ser que esta lista no menciona todas las posibles interacciones. Informe a su profesional de [employer]	[place] de [employer] productos a base de hierbas, medicamentos de [place] o suplementos nutritivos que est tomando. Si usted fuma, consume bebidas alcohlicas o si utiliza  drogas ilegales, indqueselo tambin a su profesional de [employer]	[place]. Algunas sustancias pueden  interactuar con su medicamento. A qu debo estar atento al usar [employer]? Visite a su mdico o a su profesional de la salud para chequear su evolucin peridicamente. Mientras permanezca en terapia con este medicamento, deber hacerse exmenes de Papanicolaou y exmenes de las mamas y la pelvis en forma regular. Tambin debe discutir con su profesional de la salud la necesidad de Sport and exercise psychologist peridicamente y seguir las pautas que este profesional establezca para estas pruebas. Este medicamento puede hacer que su cuerpo retenga lquido, lo que puede provocar que se le hinchen los dedos, manos o tobillos. Su presin sangunea Regulatory affairs officer. Comunquese con su mdico o con su profesional de la salud si siente que est reteniendo lquido. Si tiene algn motivo para pensar que est embarazada, deje de usar este medicamento de inmediato y comunquese con su mdico o con su profesional de Technical sales engineer. El hbito de fumar aumenta el riesgo de formacin de cogulos o de sufrir un derrame cerebral mientras este usando este medicamento, especialmente si tiene ms de 35 aos. Se le recomienda enfticamente que no fume. Si [place] lentes de contacto y observa cambios en la visin, o si los lentes comienzan a resultarle incmodos, consulte al especialista en ojos o su profesional de [employer]	[place]. Este medicamento incrementa el riesgo de desarrollar una enfermedad (hiperplasia del endometrio) que puede derivar en cncer del revestimiento del tero en mujeres que an tienen su tero. Al tomar otra hormona llamada progestina junto con este medicamento, se disminuye el riesgo de desarrollar esta enfermedad. Por lo tanto, si no le han extirpado el tero (por medio de una histerectoma), su mdico puede recetarle progestina para acompaar el tratamiento con estrgeno. Sin embargo, debe saber que puede correr [employer] adicionales  para la salud al tomar estrgenos con progestina. Debe consultar sobre el uso de estrgenos y progestinas a su profesional de la salud a fin de Aeronautical engineer beneficios y Gaffer que puede [place]. Si va a someterse a una operacin o un procedimiento MRI, tal vez deba dejar de tomar este medicamento. Consulte a su profesional de la salud antes de Editor, commissioning operacin. Puede baarse y Optometrist otras actividades mientras [place] el parche. Si el parche se afloja o se desprende, puede volver a aplicarlo siempre que quede suficientemente adherido a la piel. Al volver a aplicar el parche, debe hacerlo en una zona diferente. De lo contrario, reemplcelo por un parche nuevo. Qu efectos secundarios puedo tener al [employer] este medicamento? Efectos secundarios que debe informar a su mdico o a Barrister's clerk de la salud tan pronto como sea posible: -Chief of Staff como erupcin cutnea, picazn o urticarias, hinchazn de la cara, labios o lengua -secreciones o cambios en el tejido de las mamas -cambios en la visin -dolor en el pecho -confusin, dificultad para hablar o entender -orina de color amarillo oscuro -sensacin general de estar enfermo o sntomas gripales -heces claras -nuseas o vmito -dolor, hinchazn, sensacin clida en las piernas -dolor en la parte superior del abdomen -dolores de cabeza severos -falta de aliento repentina -debilidad o entumecimiento repentino de la cara, brazos o piernas -problemas para caminar, mareos, prdida de la coordinacin o equilibrio -sangrado vaginal inusual -color amarillento de los ojos o la piel Efectos secundarios que, por lo general, no requieren Geophysical data processor (debe informarlos a su mdico o a Barrister's clerk de la salud si persisten o si son molestos): -cada del cabello -aumento de apetito o sed -aumento de descargas de orina -sntomas de una infeccin vaginal, como picazn, irritacin  o flujo inusual -cansancio o debilidad inusual Puede  ser que esta lista no menciona todos los posibles efectos secundarios. Comunquese a su mdico por asesoramiento mdico [employer]. Usted puede informar los efectos secundarios a la FDA por telfono al 1-800-FDA-1088. Dnde debo guardar mi medicina? Mantngala fuera del alcance de los nios. Gurdela a temperatura ambiente a menos de 30 grados C (86 grados F). No guarde parches que hayan estado fuera de su bolsa protectora. Deseche todo el medicamento que no haya utilizado, despus de la fecha de vencimiento. Deseche los parches utilizados de [employer]. Ya que los parches usados todava pueden contener hormonas activas, doble el parche por la mitad con los lados con pegamento hacia adentro antes de desecharlo. ATENCIN: Este folleto es un resumen. Puede ser que no cubra toda la posible informacin. Si usted tiene preguntas acerca de esta medicina, consulte con su mdico, su farmacutico o su profesional de Technical sales engineer.    2016, Elsevier/Gold Standard. (2014-08-05 00:00:00)

## 2016-04-05 ENCOUNTER — Ambulatory Visit (INDEPENDENT_AMBULATORY_CARE_PROVIDER_SITE_OTHER): Payer: BLUE CROSS/BLUE SHIELD | Admitting: Family Medicine

## 2016-04-05 VITALS — BP 90/64 | HR 69 | Temp 98.4°F | Resp 16 | Ht 62.5 in | Wt 180.8 lb

## 2016-04-05 DIAGNOSIS — E6609 Other obesity due to excess calories: Secondary | ICD-10-CM

## 2016-04-05 DIAGNOSIS — R5382 Chronic fatigue, unspecified: Secondary | ICD-10-CM | POA: Diagnosis not present

## 2016-04-05 DIAGNOSIS — M62838 Other muscle spasm: Secondary | ICD-10-CM | POA: Diagnosis not present

## 2016-04-05 DIAGNOSIS — Z13220 Encounter for screening for lipoid disorders: Secondary | ICD-10-CM | POA: Diagnosis not present

## 2016-04-05 DIAGNOSIS — M8588 Other specified disorders of bone density and structure, other site: Secondary | ICD-10-CM | POA: Diagnosis not present

## 2016-04-05 DIAGNOSIS — Z6832 Body mass index (BMI) 32.0-32.9, adult: Secondary | ICD-10-CM | POA: Diagnosis not present

## 2016-04-05 DIAGNOSIS — G44209 Tension-type headache, unspecified, not intractable: Secondary | ICD-10-CM | POA: Diagnosis not present

## 2016-04-05 LAB — BASIC METABOLIC PANEL
BUN: 13 mg/dL (ref 7–25)
CO2: 27 mmol/L (ref 20–31)
Calcium: 9.8 mg/dL (ref 8.6–10.2)
Chloride: 105 mmol/L (ref 98–110)
Creat: 0.73 mg/dL (ref 0.50–1.10)
GLUCOSE: 92 mg/dL (ref 65–99)
POTASSIUM: 4.5 mmol/L (ref 3.5–5.3)
SODIUM: 140 mmol/L (ref 135–146)

## 2016-04-05 LAB — LIPID PANEL
Cholesterol: 192 mg/dL (ref 125–200)
HDL: 38 mg/dL — ABNORMAL LOW (ref >=46)
LDL Cholesterol: 127 mg/dL (ref <130)
Total CHOL/HDL Ratio: 5.1 Ratio — ABNORMAL HIGH (ref <=5.0)
Triglycerides: 133 mg/dL (ref <150)
VLDL: 27 mg/dL (ref <30)

## 2016-04-05 LAB — POCT GLYCOSYLATED HEMOGLOBIN (HGB A1C): Hemoglobin A1C: 6.1

## 2016-04-05 LAB — VITAMIN B12: VITAMIN B 12: 768 pg/mL (ref 200–1100)

## 2016-04-05 LAB — TSH: TSH: 1.44 m[IU]/L

## 2016-04-05 NOTE — Progress Notes (Signed)
Chief Complaint  Patient presents with  . Headache    x 3 weeks - the pain goes down to Lt side of neck    HPI  Translator: Raquel Sarna, daughter  Headache and Neck Pain- new problem 3 weeks of intermittent pain Pain in the left side of the head going all the way down to her shoulder She reports that the pain is never on the right side Pain sometimes starts from the temple She puts some pressure on the temple and gets some pain returns  Pain is 3/10 Headache frequency 3 times per week Worse at night  Aggravated by pressing on her temple, being hungry  Alleviated by eating food if she is hungry, stretching helps   Interventions tried heat and stretching  Chronic Fatigue - new problem initial workup She would also like to be screened for vitamin D def She was diagnosed with osteopenia of the lumbar spine She also reports that she just feels tired Due to knee problems she does not exercise She eats red meat and has a good diet She denies anxiety or depression   Past Medical History:  Diagnosis Date  . Fibroid     Current Outpatient Prescriptions  Medication Sig Dispense Refill  . Calcium Carbonate-Vit D-Min (CALCIUM 1200 PO) Take by mouth.    . estradiol (VIVELLE-DOT) 0.05 MG/24HR patch Place 1 patch (0.05 mg total) onto the skin 2 (two) times a week. 8 patch 12   No current facility-administered medications for this visit.     Allergies:  Allergies  Allergen Reactions  . Hydrocodone     Past Surgical History:  Procedure Laterality Date  . ABDOMINAL HYSTERECTOMY     abdominal hysterectomy with ovarian preservation   . ANKLE FRACTURE SURGERY    . FOOT SURGERY Right   . KNEE SURGERY Left    X2    Social History   Social History  . Marital status: Married    Spouse name: N/A  . Number of children: N/A  . Years of education: N/A   Social History Main Topics  . Smoking status: Never Smoker  . Smokeless tobacco: Never Used  . Alcohol use No  . Drug use:  No  . Sexual activity: No   Other Topics Concern  . None   Social History Narrative  . None    ROS  no chest pain or palpitations No shortness of breath or wheezing No nausea, vomiting or diarrhea Objective: Vitals:   04/05/16 0854  BP: 90/64  Pulse: 69  Resp: 16  Temp: 98.4 F (36.9 C)  TempSrc: Oral  SpO2: 96%  Weight: 180 lb 12.8 oz (82 kg)  Height: 5' 2.5" (1.588 m)   Body mass index is 32.54 kg/m.  Physical Exam  Constitutional: She is oriented to person, place, and time. She appears well-developed and well-nourished.  HENT:  Head: Normocephalic and atraumatic.  Eyes: Conjunctivae and EOM are normal. Pupils are equal, round, and reactive to light.  Fundoscopic exam:      The right eye shows no hemorrhage and no papilledema.       The left eye shows no hemorrhage and no papilledema.  Neck: Normal range of motion. Neck supple.  Cardiovascular: Normal rate, regular rhythm and normal heart sounds.   Pulmonary/Chest: Effort normal and breath sounds normal. No respiratory distress. She has no wheezes.  Musculoskeletal: Normal range of motion.  Lymphadenopathy:    She has no cervical adenopathy.  Neurological: She is alert and oriented to person,  place, and time. She has normal strength. No cranial nerve deficit.  Reflex Scores:      Tricep reflexes are 2+ on the right side and 2+ on the left side.      Bicep reflexes are 2+ on the right side and 2+ on the left side.      Brachioradialis reflexes are 2+ on the right side and 2+ on the left side. Psychiatric: She has a normal mood and affect. Her behavior is normal. Judgment and thought content normal.    Assessment and Plan Jatanna was seen today for headache.  Diagnoses and all orders for this visit:  Tension headache -     Ambulatory referral to Physical Therapy  Trapezius muscle spasm -     Ambulatory referral to Physical Therapy  Screening, lipid- reviewed family history, diet and exercise Will screen  today -     Lipid panel  Osteopenia of spine- based on pt report of bone density findings Will screen for renal disorder and vit D def -     VITAMIN D 25 Hydroxy (Vit-D Deficiency, Fractures) -     Basic metabolic panel  Chronic fatigue-  Will check endocrine and nutritional factors -     VITAMIN D 25 Hydroxy (Vit-D Deficiency, Fractures) -     Vitamin B12 -     TSH  Class 1 obesity due to excess calories without serious comorbidity with body mass index (BMI) of 32.0 to 32.9 in adult -   Discussed exercising in a low impact way so that she does not affect her knees. Advised recumbent bike or walking in a pool  -     Lipid panel -     POCT glycosylated hemoglobin (Hb A1C)     Noelia Lenart A Netasha Wehrli

## 2016-04-06 LAB — VITAMIN D 25 HYDROXY (VIT D DEFICIENCY, FRACTURES): VIT D 25 HYDROXY: 22 ng/mL — AB (ref 30–100)

## 2016-04-11 ENCOUNTER — Encounter: Payer: Self-pay | Admitting: *Deleted

## 2016-04-18 ENCOUNTER — Other Ambulatory Visit: Payer: Self-pay

## 2016-04-18 MED ORDER — ESTRADIOL 0.05 MG/24HR TD PTTW: 1.0000 | MEDICATED_PATCH | TRANSDERMAL | 3 refills | Status: DC

## 2016-04-19 ENCOUNTER — Other Ambulatory Visit: Payer: Self-pay

## 2016-11-09 ENCOUNTER — Encounter: Payer: Self-pay | Admitting: Gynecology

## 2017-03-20 ENCOUNTER — Encounter: Payer: BLUE CROSS/BLUE SHIELD | Admitting: Gynecology

## 2017-03-20 ENCOUNTER — Telehealth: Payer: Self-pay | Admitting: *Deleted

## 2017-03-20 ENCOUNTER — Encounter: Payer: Self-pay | Admitting: Obstetrics & Gynecology

## 2017-03-20 ENCOUNTER — Ambulatory Visit (INDEPENDENT_AMBULATORY_CARE_PROVIDER_SITE_OTHER): Payer: BLUE CROSS/BLUE SHIELD | Admitting: Obstetrics & Gynecology

## 2017-03-20 VITALS — BP 128/86 | Ht 61.5 in | Wt 176.0 lb

## 2017-03-20 DIAGNOSIS — Z803 Family history of malignant neoplasm of breast: Secondary | ICD-10-CM | POA: Diagnosis not present

## 2017-03-20 DIAGNOSIS — Z1211 Encounter for screening for malignant neoplasm of colon: Secondary | ICD-10-CM

## 2017-03-20 DIAGNOSIS — Z01411 Encounter for gynecological examination (general) (routine) with abnormal findings: Secondary | ICD-10-CM | POA: Diagnosis not present

## 2017-03-20 DIAGNOSIS — N951 Menopausal and female climacteric states: Secondary | ICD-10-CM | POA: Diagnosis not present

## 2017-03-20 NOTE — Telephone Encounter (Signed)
Referral placed at [facility] they will contact pt to schedule. 

## 2017-03-20 NOTE — Telephone Encounter (Signed)
-----   Message from Princess Bruins, MD sent at 03/20/2017 11:48 AM EDT ----- Regarding: Refer to [facility] for screening Colonoscopy 51 yo

## 2017-03-20 NOTE — Progress Notes (Signed)
Jeanette Cooper [facility] 07-05-1965 [id number]   History:    51 y.o. G3P3 Married.  Children 20-30-40's.  5 grand-children.  RP:  Established patient presenting for annual gyn exam   HPI:  S/P Hysterectomy.  Pap normal 02/2016.  [place] 66.2 02/2106.  Hot flushes and night sweats every day.  Interfering with daily activities and sleep.  Good moods, no symptom of depression.  No pelvic pain.  Normal vaginal secretions.  Breasts wnl.  Fam h/o Mother with Breast Cancer.  Mictions/BMs wnl.    Past medical history,surgical history, family history and social history were all reviewed and documented in the EPIC chart.  Gynecologic History No LMP recorded. Patient has had a hysterectomy. Contraception: status post hysterectomy Last Pap: 02/2016. Results were: normal Last mammogram: 2017. Results were: normal Conoloscopy not done yet  Obstetric History OB History  Gravida Para Term Preterm AB Living  3 3       3   SAB TAB Ectopic Multiple Live Births               # Outcome Date GA Lbr Len/2nd Weight Sex Delivery Anes PTL Lv  3 Para      Vag-Spont     2 Para      Vag-Spont     1 Para      Vag-Spont          ROS: A ROS was performed and pertinent positives and negatives are included in the history.  GENERAL: No fevers or chills. HEENT: No change in vision, no earache, sore throat or sinus congestion. NECK: No pain or stiffness. CARDIOVASCULAR: No chest pain or pressure. No palpitations. PULMONARY: No shortness of breath, cough or wheeze. GASTROINTESTINAL: No abdominal pain, nausea, vomiting or diarrhea, melena or bright red blood per rectum. GENITOURINARY: No urinary frequency, urgency, hesitancy or dysuria. MUSCULOSKELETAL: No joint or muscle pain, no back pain, no recent trauma. DERMATOLOGIC: No rash, no itching, no lesions. ENDOCRINE: No polyuria, polydipsia, no heat or cold intolerance. No recent change in weight. HEMATOLOGICAL: No anemia or easy bruising or bleeding. NEUROLOGIC: No headache,  seizures, numbness, tingling or weakness. PSYCHIATRIC: No depression, no loss of interest in normal activity or change in sleep pattern.     Exam:   BP 128/86   Ht 5' 1.5" (1.562 m)   Wt 176 lb (79.8 kg)   BMI 32.72 kg/m   Body mass index is 32.72 kg/m.  General appearance : Well developed well nourished female. No acute distress HEENT: Eyes: no retinal hemorrhage or exudates,  Neck supple, trachea midline, no carotid bruits, no thyroidmegaly Lungs: Clear to auscultation, no rhonchi or wheezes, or rib retractions  Heart: Regular rate and rhythm, no murmurs or gallops Breast:Examined in sitting and supine position were symmetrical in appearance, no palpable masses or tenderness,  no skin retraction, no nipple inversion, no nipple discharge, no skin discoloration, no axillary or supraclavicular lymphadenopathy Abdomen: no palpable masses or tenderness, no rebound or guarding Extremities: no edema or skin discoloration or tenderness  Pelvic: Vulva normal  Bartholin, Urethra, Skene Glands: Within normal limits             Vagina: No gross lesions or discharge  Uterus/Cervix absent  Adnexa  Without masses or tenderness  Anus and perineum  normal   Assessment/Plan:  51 y.o. female for annual exam   1. Encounter for gynecological examination with abnormal finding Gyn exam s/p total hysterectomy.  Pap normal 2017.  Breasts wnl.  Will schedule screening  [place] 50 yo.  2. Menopausal syndrome Recent Dx of Breast Ca in mother.  Patient was started on Vivelle 0.05 by Dr Toney Rakes 03/2016, but stopped because of her mother's diagnosis.   Vasomotor symptoms are moderate and interfering with daily activities. Recommend Phyto Estrogens like Black Cohash and Soy products in nutrition.  Declines attempting relative control with Progesterone or an Anti-Depressant at this time.  Vitamin D supplement/Ca++ in food and weight bearing physical activity for bone mass preservation.  Schedule Bone  Density.  3. Family history of breast cancer in mother Recent Dx.  Princess Bruins MD, 11:09 AM 03/20/2017

## 2017-03-24 NOTE — Telephone Encounter (Signed)
Per [facility] "Spoke with patient states she needs to call insurance and will call back to reschedule."

## 2017-03-25 NOTE — Patient Instructions (Signed)
1. Encounter for gynecological examination with abnormal finding Gyn exam s/p total hysterectomy.  Pap normal 2017.  Breasts wnl.  Will schedule screening [place] 51 yo.  2. Menopausal syndrome Recent Dx of Breast Ca in mother.  Patient was started on Vivelle 0.05 by Dr Toney Rakes 03/2016, but stopped because of her mother's diagnosis.   Vasomotor symptoms are moderate and interfering with daily activities. Recommend Phyto Estrogens like Black Cohash and Soy products in nutrition.  Declines attempting relative control with Progesterone or an Anti-Depressant at this time.  Vitamin D supplement/Ca++ in food and weight bearing physical activity for bone mass preservation.  Schedule Bone Density.  3. Family history of breast cancer in mother Recent Dx.  Verdis Frederickson, fue un placer conecerla hoy!     Mantenimiento de la salud de las mujeres posmenopusicas (Health Maintenance for Postmenopausal Women) La menopausia es un proceso normal en el cual se pierde la capacidad reproductiva. Este proceso ocurre gradualmente a lo largo de un perodo de meses o aos, por lo general entre los 26 y los 50aos. La menopausia es completa cuando no se han tenido 84mnstruaciones consecutivas. Es importante hablar con el mdico sobre algunas de las enfermedades ms comunes que afectan a las mujeres posmenopusicas, como la cardiopata coronaria, el cncer y la prdida de la masa sea (osteoporosis). Adoptar un estilo de vida saludable y recibir atencin preventiva pueden ayudar a promover la salud y eMusician Adems, estas medidas pueden reducir las probabilidades de desarrollar algunas de estas enfermedades frecuentes. QU DEBO SABER ACERCA DE LKiana Durante le mLillian puede tener una serie de sntomas, por ejemplo:  Calores repentinos moderados a graves.  Sudoracin nocturna.  Disminucin del deseo sexual.  Cambios en el estado de nimo.  Dolores de  c[place]  Cansancio.  Irritabilidad.  Problemas de memoria.  Insomnio. Tratar o no los cambios que ocurren en la menopausia es una decisin personal que se toma con el mdico. QU DEBO SABER SOBRE LOS TRATAMIENTOS DE REPOSICIN HORMONAL Y LOS SUPLEMENTOS? Los productos para la terapia hormonal son eficaces para tratar los sntomas que se asocian con la menopausia, como los calores repentinos y las sudoraciones nocturnas. La reposicin hormonal conlleva ciertos riesgos, especialmente a medida que una mujer envejece. Si est pensando en usar tratamientos con estrgeno o estrgeno con progesterona, analice los beneficios y los riesgos con el mdico. QU DEBO SABER SPenbrookLMartins[place] A medida que una persona envejece, aumenta la probabilidad de tener cardiopata coronaria, infarto de miocardio e ictus. Esto puede deberse, en parte, a los cambios hormonales que atraviesa el cuerpo durante la menopausia. Estos cambios pueden afectar la forma en que el organismo procesa las gRigby los triglicridos y el colesterol de su dieta. El infarto de miocardio y el ictus son emergencias mdicas. Hay muchas cosas que se pueden hacer para ayudar a prevenir la cardiopata coronaria y el ictus:  Debe controlar su presin arterial al menos cada uno o dBay [place] La hipertensin arterial causa enfermedades cardacas y aSerbiael riesgo de ictus.  Si tiene entre 50y 79aos, consulte al mdico si debe tomar aspirina para prevenir un infarto de miocardio o un ictus.  No consuma ningn producto que contenga tabaco, lo que incluye cigarrillos, tabaco de mHigher education careers advisero cPsychologist, sport and exercise Si necesita ayuda para dejar de fumar, consulte al mMeadWestvaco  Es importante seguir una dieta sana y mTheatre managerun peso saludable. ? Asegrese de iFamily Dollar Storesverduras, frutas, productos lcteos de bajo contenido de gDjiboutiy  protenas magras. ? No consuma alimentos con alto contenido de grasas slidas, azcares  agregados o sal (sodio).  Realice actividad fsica con regularidad. Esta es una de las cosas ms importantes que puede hacer por su salud. ? Intente realizar al menos 163mnutos de actividad fsica por semana. El tipo de ejercicio que realice debe aumentar la frecuencia cardaca y hacerla sudar. Esto se conoce como ejercicio de i[place] ? Intente hacer ejercicios de elongacin por lo menos dos veces por semana. Agrguelos al plan de ejercicio de intensidad moderada.  Conozca sus cifras. Pdale al mdico que le controle el colesterol y el nivel sanguneo de glucosa. Siga hacindose anlisis de sAmerican Electric Powerse lo haya indicado el mdico. QU DEBO SABER SOBRE LAS PRUEBAS DE DETECCIN DEL CNCER? Hay varios tipos de cncer. Tome las siguientes medidas para reducir el riesgo y dActuaryformacin cancerosa lo antes posible. Cncer de mama  Practique la autoconciencia de la mama. ? Esto significa reconocer la apariencia normal de sus mamas y cmo las siente. ? Tambin significa realizar autoexmenes regulares de las mSheldon Informe a su mdico sobre cualquier cambio, sin importar cun pequeo sea.  Si es mayor de 50aos, visite a un mdico para qPublic librarianun examen de las mamas (exploracin clnica mamaria o ECM) todos los aEast Kingston En funcin de sToysRus los antecedentes familiares y la historia cNekoma tal vez sea recomendable que tambin se haga una radiografa anual de las mamas ([place].  Si tiene antecedentes familiares de cncer de mama, hable con el mdico para someterse a un estudio gentico.  Si tiene alto riesgo de pChief Financial Officerde mama, hable con el mdico para hacerse a uPublic house manager(RM) y uLavinia Sharpstodos los aMeadow  La evaluacin del gen del cncer de mama (BRCA) se recomienda a las mujeres que tengan familiares con tumores malignos relacionados con el BRCA. Los resultados de la evaluacin determinarn la necesidad de recibir asesoramiento  gentico y pBuilding services engineerde deteccin del BRCA1 y el BRCA2. Los tumores malignos relacionados con el BRCA incluyen estos tipos: ? Mama. Este tipo se presenta en hombres o mujeres. ? Ovario. ? Trompas. A este tipo tambin se lo llama cncer de trompa de [place]. ? Cncer de la pared abdominal o plvica (cncer de peritoneo). ? Prstata. ? Pncreas. Cncer de cuello uterino, de tero y de ovario El mdico puede recomendarle que se haga pruebas peridicas de deteccin de cncer de los rganos de la pelvis, los cuales iVerizonovarios, el tero y la vagina. Estas pruebas incluyen un examen plvico, que abarca controlar si se produjeron cambios microscpicos en la superficie del cuello del tero (prueba de Papanicolaou).  A las mujeres que tCircuit City21 y 660aos los mdicos pueden recomendarles que se realicen un examen plvico y uArdelia Memsprueba de Papanicolaou cada tres aos. A las mujeres que tienen entre 30 y 65aos, pueden recomendarles la prueba de Papanicolaou y el examen plvico, en combinacin con una prueba de deteccin del virus del papiloma humano (VPH) cCarlyle Algunos tipos de VPH aumentan el riesgo de pChief Financial Officerde cuello del tero. La prueba para la deteccin del VPH tambin puede realizarse a mujeres de cualquier edad cuyos resultados de la prueba de Papanicolaou no sean claros.  Es posible que otros mdicos no recomienden exmenes de deteccin a las mujeres no embarazadas que se consideran sujetos de bajo riesgo de pChief Financial Officerde pelvis y no tienen sntomas. Pregntele al mdico si un examen plvico de  deteccin es adecuado para usted.  Si ha recibido un tratamiento para Management consultant cervical o una enfermedad que podra causar cncer, necesitar realizarse una prueba de Papanicolaou y controles durante al menos 20 aos de concluido el [place]. Si no se ha hecho el Papanicolaou con regularidad, debern volver a evaluarse los factores de riesgo (como tener un nuevo compaero  sexual), para Chief Strategy Officer si debe empezar a International aid/development worker los estudios nuevamente. Algunas mujeres sufren problemas mdicos que aumentan la probabilidad de Primary school teacher cncer de cuello del tero. En estos casos, el mdico podr [employer] se realicen controles y pruebas de Papanicolaou con ms frecuencia.  Si tiene antecedentes familiares de cncer de tero o de ovario, hable con el mdico para someterse a un estudio gentico.  Si tiene hemorragia vaginal despus de la menopausia, informe al mdico.  En la actualidad, no hay pruebas confiables para la deteccin del cncer de ovario. Cncer de pulmn Se recomienda realizar exmenes de deteccin de cncer de pulmn a personas adultas entre 55 y 80 aos que estn en riesgo de Geophysical data processor de pulmn por sus antecedentes de consumo de tabaco. Se recomienda una tomografa computarizada (TC) de baja dosis de los pulmones todos los aos si usted:  Fuma actualmente.  Ha fumado durante 30aos un paquete diario y sigue fumando o dej el hbito en algn momento en los ltimos 15aos. Un paquete-ao equivale a fumar en promedio un paquete de cigarrillos diario durante un ao. Los exmenes de deteccin anuales:  Deben hacerse hasta que hayan pasado 15aos desde que dej de fumar.  Deben dejar de realizarse si tiene un problema de salud que le impide recibir tratamiento para el cncer de pulmn. Cncer colorrectal  Este tipo de cncer puede detectarse y a menudo prevenirse.  El estudio de [place] de Airline pilot del cncer colorrectal debe comenzar a Geographical information systems officer a Glass blower/designer de los 50aos y [place].  El mdico puede aconsejarle que lo haga antes, si tiene factores de riesgo de Warehouse manager cncer de colon.  Si tiene antecedentes familiares de cncer colorrectal, hable con el mdico para someterse a un estudio gentico.  El mdico tambin puede recomendarle que use un kit de prueba para Advice worker a fin de Museum/gallery curator en la materia fecal.  Es  posible que se use una pequea cmara en el extremo de un tubo para examinar directamente el colon (sigmoidoscopia o colonoscopia) a fin de Engineer, manufacturing formas tempranas de cncer colorrectal.  El examen directo del colon se debe repetir cada 5 a 10aos hasta los 75aos. Sin embargo, si se hallan formas incipientes de plipos precancerosos o pequeos tumores, o si tiene antecedentes familiares o riesgo gentico de Public relations account executive, debe realizarse exmenes de deteccin con ms frecuencia. Cncer de piel  Revise la piel de la cabeza a los pies con regularidad.  Contrlese los lunares. Infrmele al mdico: ? Si aparecen nuevos lunares o los que tiene se modifican, especialmente en su forma o color. ? Si tiene un lunar que es ms grande que el tamao de una goma de Paramedic.  Si alguno de los miembros de su familia tiene antecedentes de cncer de piel, especialmente a una edad temprana, hable con el mdico para someterse a pruebas genticas.  Siempre use pantalla solar. Aplique pantalla solar de Pietro Cassis y repetida a lo largo del Futures trader.  Protjase usando mangas y Automatic Data, un sombrero de ala ancha y gafas para el sol, siempre que est al [place]. QU DEBO SABER SOBRE  LA OSTEOPOROSIS? La osteoporosis es una afeccin en la cual la destruccin de la masa sea ocurre con mayor rapidez que su formacin. Despus de la menopausia, puede correr un riesgo ms alto de tener osteoporosis. Para ayudar a prevenir esta afeccin o las fracturas seas que pueden ocurrir a causa de [place], se recomienda lo siguiente:  Si tiene entre 19 y 50aos, tome como mnimo '1000mg'$  de calcio y '600mg'$  de vitaminaD por Training and development officer.  Si es mayor de 50aos pero menor de 70aos, tome como mnimo '1200mg'$  de calcio y '600mg'$  de vitaminaD por Training and development officer.  Si es mayor de 70aos, tome como mnimo '1200mg'$  de calcio y '800mg'$  de vitaminaD por Training and development officer. El tabaquismo y el consumo excesivo de alcohol aumentan el riesgo de osteoporosis.  Consuma alimentos ricos en calcio y vitaminaD, y haga ejercicios con soporte de peso varias veces a la semana, como se lo haya indicado el mdico. QU DEBO SABER SOBRE EL MODO EN QUE LA MENOPAUSIA AFECTA MI SALUD MENTAL? La depresin puede presentarse a cualquier edad, pero es ms frecuente a medida que una persona envejece. Los sntomas comunes de depresin incluyen lo siguiente:  Desnimo o tristeza.  Cambios en los patrones de sueo.  Cambios en el apetito o en los hbitos de alimentacin.  Sensacin de falta general de motivacin o placer al Yahoo actividades que sola disfrutar.  Crisis frecuentes de llanto. Hable con el mdico si cree que tiene depresin. QU DEBO SABER SOBRE LAS VACUNAS? Es importante que se aplique las vacunas y [place]. Estas incluyen las siguientes:  Vacuna contra el ttanos, la difteria y la tosferina (Tdap).  Vacuna anual contra la gripe antes del inicio de la temporada de gripe.  Vacuna contra la neumona.  Vacuna contra el herpes. El mdico tambin puede recomendarle que se aplique otras vacunas. Esta informacin no tiene Marine scientist el consejo del mdico. Asegrese de hacerle al mdico cualquier pregunta que tenga. Document Released: 04/03/2013 Document Revised: 07/04/2014 Document Reviewed: 03/17/2015 Elsevier Interactive Patient Education  2018 [employer].

## 2017-04-06 ENCOUNTER — Other Ambulatory Visit: Payer: Self-pay | Admitting: Gynecology

## 2017-04-06 DIAGNOSIS — Z13820 Encounter for screening for osteoporosis: Secondary | ICD-10-CM

## 2017-04-19 ENCOUNTER — Encounter: Payer: Self-pay | Admitting: Obstetrics & Gynecology

## 2018-09-19 ENCOUNTER — Other Ambulatory Visit: Payer: Self-pay

## 2018-09-19 ENCOUNTER — Telehealth (INDEPENDENT_AMBULATORY_CARE_PROVIDER_SITE_OTHER): Payer: Self-pay | Admitting: Family Medicine

## 2018-09-19 DIAGNOSIS — F410 Panic disorder [episodic paroxysmal anxiety] without agoraphobia: Secondary | ICD-10-CM

## 2018-09-19 DIAGNOSIS — J019 Acute sinusitis, unspecified: Secondary | ICD-10-CM

## 2018-09-19 DIAGNOSIS — F4322 Adjustment disorder with anxiety: Secondary | ICD-10-CM

## 2018-09-19 MED ORDER — AMOXICILLIN-POT CLAVULANATE 875-125 MG PO TABS: 1.0000 | ORAL_TABLET | Freq: Two times a day (BID) | ORAL | 0 refills | Status: DC

## 2018-09-19 MED ORDER — SERTRALINE HCL 50 MG PO TABS: 50.0000 mg | ORAL_TABLET | Freq: Every day | ORAL | 3 refills | Status: DC

## 2018-09-19 NOTE — Progress Notes (Signed)
Virtual Visit via telephone Note  I connected with patient on 09/19/18 at 234pm by telephone and verified that I am speaking with the correct person using two identifiers. Jeanette Cooper is currently located at home and family (52yo granddaughter) is currently with her during visit. The provider, Rutherford Guys, MD is located in their home at time of visit.  I discussed the limitations, risks, security and privacy concerns of performing an evaluation and management service by telephone and the availability of in person appointments. I also discussed with the patient that there may be a patient responsible charge related to this service. The patient expressed understanding and agreed to proceed.  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 0 No  Follow up Falls evaluation completed -     Depression screen [facility] 2/9 09/19/2018 04/05/2016  Decreased Interest 1 1  Down, Depressed, Hopeless 1 1  PHQ - 2 Score 2 2  Altered sleeping 1 3  Tired, decreased energy 1 3  Change in appetite 1 3  Feeling bad or failure about yourself  1 1  Trouble concentrating 0 0  Moving slowly or fidgety/restless 0 0  Suicidal thoughts 0 0  PHQ-9 Score 6 12  Difficult doing work/chores - Not difficult at all    Allergies  Allergen Reactions  . Hydrocodone     Prior to Admission medications    Medication Sig Start Date End Date Taking? Authorizing Provider  Calcium Carbonate-Vit D-Min (CALCIUM 1200 PO) Take by mouth.    [provider]    Past Medical History:  Diagnosis Date  . Fibroid     Past Surgical History:  Procedure Laterality Date  . ABDOMINAL HYSTERECTOMY     abdominal hysterectomy with ovarian preservation   . ANKLE FRACTURE SURGERY    . FOOT SURGERY Right   . KNEE SURGERY Left    X2    Social History   Tobacco Use  . Smoking status: Never Smoker  . Smokeless tobacco: Never Used  Substance Use Topics  . Alcohol use: No    Family History  Problem Relation Age of Onset  . Cancer Mother        stomach  . Diabetes Paternal Grandmother     ROS  Objective  Vitals as reported by the patient: none  There were no vitals filed for this visit.  ASSESSMENT and PLAN  1. Acute non-recurrent sinusitis, unspecified location Discussed supportive measures, new meds r/se/b and RTC precautions.  2. Adjustment disorder with anxious mood 3. Panic attacks New diagnosis. Discussed treatment options. Starting sertraline, discussed wean upto 50mg . Reviewed r/se/b,  Other orders - amoxicillin-clavulanate (AUGMENTIN) 875-125 MG tablet; Take 1 tablet by mouth 2 (two) times daily. - sertraline (ZOLOFT) 50 MG tablet; Take 1 tablet (50 mg total) by mouth daily.  FOLLOW-UP: 3 weeks   The above assessment and management plan was discussed with the patient. The patient verbalized understanding  of and has agreed to the management plan. Patient is aware to call the clinic if symptoms persist or worsen. Patient is aware when to return to the clinic for a follow-up visit. Patient educated on when it is appropriate to go to the emergency department.    I provided 12 minutes of non-face-to-face time during this encounter.  Rutherford Guys, MD [facility] at [place],  [address] Ph.  [phone] Fax [phone]

## 2018-10-13 ENCOUNTER — Encounter (HOSPITAL_BASED_OUTPATIENT_CLINIC_OR_DEPARTMENT_OTHER): Payer: Self-pay | Admitting: *Deleted

## 2018-10-13 ENCOUNTER — Other Ambulatory Visit: Payer: Self-pay

## 2018-10-13 ENCOUNTER — Emergency Department (HOSPITAL_BASED_OUTPATIENT_CLINIC_OR_DEPARTMENT_OTHER): Payer: Self-pay

## 2018-10-13 ENCOUNTER — Emergency Department (HOSPITAL_BASED_OUTPATIENT_CLINIC_OR_DEPARTMENT_OTHER)
Admission: EM | Admit: 2018-10-13 | Discharge: 2018-10-13 | Disposition: A | Discharge status: Home / Self Care | Payer: Self-pay | Attending: Emergency Medicine | Admitting: Emergency Medicine

## 2018-10-13 DIAGNOSIS — Z79899 Other long term (current) drug therapy: Secondary | ICD-10-CM | POA: Insufficient documentation

## 2018-10-13 DIAGNOSIS — R05 Cough: Secondary | ICD-10-CM | POA: Insufficient documentation

## 2018-10-13 DIAGNOSIS — R0602 Shortness of breath: Secondary | ICD-10-CM | POA: Insufficient documentation

## 2018-10-13 LAB — CBC WITH DIFFERENTIAL/PLATELET
Abs Immature Granulocytes: 0.02 10*3/uL (ref 0.00–0.07)
Basophils Absolute: 0.1 10*3/uL (ref 0.0–0.1)
Basophils Relative: 1 %
Eosinophils Absolute: 0 10*3/uL (ref 0.0–0.5)
Eosinophils Relative: 1 %
HCT: 41.7 % (ref 36.0–46.0)
Hemoglobin: 13.8 g/dL (ref 12.0–15.0)
Immature Granulocytes: 0 %
Lymphocytes Relative: 35 %
Lymphs Abs: 2.6 10*3/uL (ref 0.7–4.0)
MCH: 30.2 pg (ref 26.0–34.0)
MCHC: 33.1 g/dL (ref 30.0–36.0)
MCV: 91.2 fL (ref 80.0–100.0)
Monocytes Absolute: 0.5 10*3/uL (ref 0.1–1.0)
Monocytes Relative: 7 %
Neutro Abs: 4.3 10*3/uL (ref 1.7–7.7)
Neutrophils Relative %: 56 %
Platelets: 217 10*3/uL (ref 150–400)
RBC: 4.57 MIL/uL (ref 3.87–5.11)
RDW: 12.1 % (ref 11.5–15.5)
WBC: 7.5 10*3/uL (ref 4.0–10.5)
nRBC: 0 % (ref 0.0–0.2)

## 2018-10-13 LAB — TROPONIN I: Troponin I: <0.03 ng/mL (ref <0.03)

## 2018-10-13 LAB — COMPREHENSIVE METABOLIC PANEL
ALT: 16 U/L (ref 0–44)
AST: 18 U/L (ref 15–41)
Albumin: 4.5 g/dL (ref 3.5–5.0)
Alkaline Phosphatase: 79 U/L (ref 38–126)
Anion gap: 8 (ref 5–15)
BUN: 15 mg/dL (ref 6–20)
CO2: 26 mmol/L (ref 22–32)
Calcium: 9.4 mg/dL (ref 8.9–10.3)
Chloride: 103 mmol/L (ref 98–111)
Creatinine, Ser: 0.77 mg/dL (ref 0.44–1.00)
GFR calc Af Amer: >60 mL/min (ref >60)
GFR calc non Af Amer: >60 mL/min (ref >60)
Glucose, Bld: 112 mg/dL — ABNORMAL HIGH (ref 70–99)
Potassium: 4.5 mmol/L (ref 3.5–5.1)
Sodium: 137 mmol/L (ref 135–145)
Total Bilirubin: 0.5 mg/dL (ref 0.3–1.2)
Total Protein: 8 g/dL (ref 6.5–8.1)

## 2018-10-13 MED ORDER — ACETAMINOPHEN 325 MG PO TABS
650.0000 mg | ORAL_TABLET | Freq: Once | ORAL | Status: AC
Start: 2018-10-13 — End: 2018-10-13
  Administered 2018-10-13: 19:00:00 650 mg via ORAL
  Filled 2018-10-13: qty 2

## 2018-10-13 NOTE — ED Notes (Signed)
Unable to obtain to esignature d/t pad not working.

## 2018-10-13 NOTE — ED Provider Notes (Signed)
[facility] Provider Note   CSN: [id number] Arrival date & time: 10/13/18  1803    History   Chief Complaint Chief Complaint  Patient presents with  . Cough    chest pain    HPI Jeanette Cooper is a 53 y.o. female history of fibroids, anxiety here presenting with shortness of breath, anxiety, nonproductive cough.  Patient was diagnosed with sinusitis about 3 weeks ago.  Patient was prescribed Augmentin but did not finish the prescription.  The last 2 weeks, patient has some chest pressure and nonproductive cough.  Patient has some subjective shortness of breath and severe anxiety.  Patient states that she reads on the news about coronavirus and she is afraid that she may have it.  She however did not have any recent travel or sick contacts or fevers. She has no known COVID contacts.      The history is provided by the patient.    Past Medical History:  Diagnosis Date  . Fibroid     Patient Active Problem List   Diagnosis Date Noted  . Adjustment disorder with anxious mood 09/19/2018  . Panic attacks 09/19/2018  . Hot flashes 03/17/2016  . Perimenopause 03/17/2016    Past Surgical History:  Procedure Laterality Date  . ABDOMINAL HYSTERECTOMY     abdominal hysterectomy with ovarian preservation   . ANKLE FRACTURE SURGERY    . FOOT SURGERY Right   . KNEE SURGERY Left    X2     OB History    Gravida  3   Para  3   Term      Preterm      AB      Living  3     SAB      TAB      Ectopic      Multiple      Live Births               Home Medications    Prior to Admission medications   Medication Sig Start Date End Date Taking? Authorizing Provider  amoxicillin-clavulanate (AUGMENTIN) 875-125 MG tablet Take 1 tablet by mouth 2 (two) times daily. 09/19/18   Rutherford Guys, MD  Calcium Carbonate-Vit D-Min (CALCIUM 1200 PO) Take by mouth.    [provider]  sertraline (ZOLOFT) 50 MG tablet Take 1 tablet  (50 mg total) by mouth daily. 09/19/18   Rutherford Guys, MD    Family History Family History  Problem Relation Age of Onset  . Cancer Mother        stomach  . Diabetes Paternal Grandmother     Social History Social History   Tobacco Use  . Smoking status: Never Smoker  . Smokeless tobacco: Never Used  Substance Use Topics  . Alcohol use: No  . Drug use: No     Allergies   Hydrocodone   Review of Systems Review of Systems  Respiratory: Positive for cough.   All other systems reviewed and are negative.    Physical Exam Updated Vital Signs BP 139/84 (BP Location: Right Arm)   Pulse 79   Temp 99 F (37.2 C) (Oral)   Resp 17   Ht 5\' 3"  (1.6 m)   Wt 78.5 kg   SpO2 98%   BMI 30.65 kg/m   Physical Exam Vitals signs and nursing note reviewed.  Constitutional:      Comments: Anxious   HENT:     Head: Normocephalic.  Right Ear: Tympanic membrane normal.     Left Ear: Tympanic membrane normal.     Nose: Nose normal.     Mouth/Throat:     Mouth: Mucous membranes are moist.  Eyes:     Extraocular Movements: Extraocular movements intact.     Pupils: Pupils are equal, round, and reactive to light.  Cardiovascular:     Rate and Rhythm: Normal rate and regular rhythm.     Pulses: Normal pulses.  Pulmonary:     Effort: Pulmonary effort is normal.     Breath sounds: Normal breath sounds.  Abdominal:     General: Abdomen is flat.     Palpations: Abdomen is soft.  Musculoskeletal: Normal range of motion.  Skin:    General: Skin is warm.     Capillary Refill: Capillary refill takes less than 2 seconds.  Neurological:     General: No focal deficit present.     Mental Status: She is alert and oriented to person, place, and time.  Psychiatric:        Mood and Affect: Mood normal.        Behavior: Behavior normal.      ED Treatments / Results  Labs (all labs ordered are listed, but only abnormal results are displayed) Labs Reviewed  CBC WITH  DIFFERENTIAL/PLATELET  COMPREHENSIVE METABOLIC PANEL  TROPONIN I    EKG EKG Interpretation  Date/Time:  Saturday October 13 2018 18:10:56 EDT Ventricular Rate:  78 PR Interval:    QRS Duration: 79 QT Interval:  361 QTC Calculation: 412 R Axis:   57 Text Interpretation:  Sinus rhythm Low voltage, precordial leads Baseline wander in lead(s) V6 No previous ECGs available Confirmed by Wandra Arthurs [phone]) on 10/13/2018 6:19:34 PM   Radiology No results found.  Procedures Procedures (including critical care time)  Medications Ordered in ED Medications  acetaminophen (TYLENOL) tablet 650 mg (650 mg Oral Given 10/13/18 1842)     Initial Impression / Assessment and Plan / ED Course  I have reviewed the triage vital signs and the nursing notes.  Pertinent labs & imaging results that were available during my care of the patient were reviewed by me and considered in my medical decision making (see chart for details).       Jeanette Cooper is a 53 y.o. female here with cough, anxiety. She is worried about COVID. However she doesn't have any known COVID contacts and has been staying at home. Afebrile, normal oxygen level. Will get labs, CXR. Doesn't meet criteria for testing for COVID.   8:33 PM Labs and CXR clear. Stable for discharge.    Jeanette Cooper was evaluated in Emergency Department on 10/13/2018 for the symptoms described in the history of present illness. She was evaluated in the context of the global COVID-19 pandemic, which necessitated consideration that the patient might be at risk for infection with the SARS-CoV-2 virus that causes COVID-19. Institutional protocols and algorithms that pertain to the evaluation of patients at risk for COVID-19 are in a state of rapid change based on information released by regulatory bodies including the CDC and federal and state organizations. These policies and algorithms were followed during the patient's care in the ED.    Final  Clinical Impressions(s) / ED Diagnoses   Final diagnoses:  None    ED Discharge Orders    None       Drenda Freeze, MD 10/13/18 2034

## 2018-10-13 NOTE — Discharge Instructions (Signed)
Take tylenol for pain or fever.   See your doctor.   Return to ER if you have worse cough, fever, congestion. See COVID 19 instructions below.      Person Under Monitoring Name: Jeanette Cooper  Location: [address] [place] [address]   Infection Prevention Recommendations for Individuals Confirmed to have, or Being Evaluated for, 2019 Novel Coronavirus (COVID-19) Infection Who Receive Care at Home  Individuals who are confirmed to have, or are being evaluated for, COVID-19 should follow the prevention steps below until a healthcare provider or local or state health department says they can return to normal activities.  Stay home except to get medical care You should restrict activities outside your home, except for getting medical care. Do not go to work, school, or public areas, and do not use public transportation or taxis.  Call ahead before visiting your doctor Before your medical appointment, call the healthcare provider and tell them that you have, or are being evaluated for, COVID-19 infection. This will help the healthcare providers office take steps to keep other people from getting infected. Ask your healthcare provider to call the local or state health department.  Monitor your symptoms Seek prompt medical attention if your illness is worsening (e.g., difficulty breathing). Before going to your medical appointment, call the healthcare provider and tell them that you have, or are being evaluated for, COVID-19 infection. Ask your healthcare provider to call the local or state health department.  Wear a facemask You should wear a facemask that covers your nose and mouth when you are in the same room with other people and when you visit a healthcare provider. People who live with or visit you should also wear a facemask while they are in the same room with you.  Separate yourself from other people in your home As much as possible, you should stay in a  different room from other people in your home. Also, you should use a separate bathroom, if available.  Avoid sharing household items You should not share dishes, drinking glasses, cups, eating utensils, towels, bedding, or other items with other people in your home. After using these items, you should wash them thoroughly with soap and water.  Cover your coughs and sneezes Cover your mouth and nose with a tissue when you cough or sneeze, or you can cough or sneeze into your sleeve. Throw used tissues in a lined trash can, and immediately wash your hands with soap and water for at least 20 seconds or use an alcohol-based hand rub.  Wash your [facility] your hands often and thoroughly with soap and water for at least 20 seconds. You can use an alcohol-based hand sanitizer if soap and water are not available and if your hands are not visibly dirty. Avoid touching your eyes, nose, and mouth with unwashed hands.   Prevention Steps for Caregivers and Household Members of Individuals Confirmed to have, or Being Evaluated for, COVID-19 Infection Being Cared for in the Home  If you live with, or provide care at home for, a person confirmed to have, or being evaluated for, COVID-19 infection please follow these guidelines to prevent infection:  Follow healthcare providers instructions Make sure that you understand and can help the patient follow any healthcare provider instructions for all care.  Provide for the patients basic needs You should help the patient with basic needs in the home and provide support for getting groceries, prescriptions, and other personal needs.  Monitor the patients symptoms If  they are getting sicker, call his or her medical provider and tell them that the patient has, or is being evaluated for, COVID-19 infection. This will help the healthcare providers office take steps to keep other people from getting infected. Ask the healthcare provider to call the local  or state health department.  Limit the number of people who have contact with the patient If possible, have only one caregiver for the patient. Other household members should stay in another home or place of residence. If this is not possible, they should stay in another room, or be separated from the patient as much as possible. Use a separate bathroom, if available. Restrict visitors who do not have an essential need to be in the home.  Keep older adults, very young children, and other sick people away from the patient Keep older adults, very young children, and those who have compromised immune systems or chronic health conditions away from the patient. This includes people with chronic heart, lung, or kidney conditions, diabetes, and cancer.  Ensure good ventilation Make sure that shared spaces in the home have good air flow, such as from an air conditioner or an opened window, weather permitting.  Wash your hands often Wash your hands often and thoroughly with soap and water for at least 20 seconds. You can use an alcohol based hand sanitizer if soap and water are not available and if your hands are not visibly dirty. Avoid touching your eyes, nose, and mouth with unwashed hands. Use disposable paper towels to dry your hands. If not available, use dedicated cloth towels and replace them when they become wet.  Wear a facemask and gloves Wear a disposable facemask at all times in the room and gloves when you touch or have contact with the patients blood, body fluids, and/or secretions or excretions, such as sweat, saliva, sputum, nasal mucus, vomit, urine, or feces.  Ensure the mask fits over your nose and mouth tightly, and do not touch it during use. Throw out disposable facemasks and gloves after using them. Do not reuse. Wash your hands immediately after removing your facemask and gloves. If your personal clothing becomes contaminated, carefully remove clothing and launder. Wash your  hands after handling contaminated clothing. Place all used disposable facemasks, gloves, and other waste in a lined container before disposing them with other household waste. Remove gloves and wash your hands immediately after handling these items.  Do not share dishes, glasses, or other household items with the patient Avoid sharing household items. You should not share dishes, drinking glasses, cups, eating utensils, towels, bedding, or other items with a patient who is confirmed to have, or being evaluated for, COVID-19 infection. After the person uses these items, you should wash them thoroughly with soap and water.  Wash laundry thoroughly Immediately remove and wash clothes or bedding that have blood, body fluids, and/or secretions or excretions, such as sweat, saliva, sputum, nasal mucus, vomit, urine, or feces, on them. Wear gloves when handling laundry from the patient. Read and follow directions on labels of laundry or clothing items and detergent. In general, wash and dry with the warmest temperatures recommended on the label.  Clean all areas the individual has used often Clean all touchable surfaces, such as counters, tabletops, doorknobs, bathroom fixtures, toilets, phones, keyboards, tablets, and bedside tables, every day. Also, clean any surfaces that may have blood, body fluids, and/or secretions or excretions on them. Wear gloves when cleaning surfaces the patient has come in contact with. Use  a diluted bleach solution (e.g., dilute bleach with 1 part bleach and 10 parts water) or a household disinfectant with a label that says EPA-registered for coronaviruses. To make a bleach solution at home, add 1 tablespoon of bleach to 1 quart (4 cups) of water. For a larger supply, add  cup of bleach to 1 gallon (16 cups) of water. Read labels of cleaning products and follow recommendations provided on product labels. Labels contain instructions for safe and effective use of the cleaning  product including precautions you should take when applying the product, such as wearing gloves or eye protection and making sure you have good ventilation during use of the product. Remove gloves and wash hands immediately after cleaning.  Monitor yourself for signs and symptoms of illness Caregivers and household members are considered close contacts, should monitor their health, and will be asked to limit movement outside of the home to the extent possible. Follow the monitoring steps for close contacts listed on the symptom monitoring form.   ? If you have additional questions, contact your local health department or call the epidemiologist on call at [phone] (available 24/7). ? This guidance is subject to change. For the most up-to-date guidance from [facility], please refer to their website: [email or web address]

## 2018-10-13 NOTE — ED Triage Notes (Addendum)
Pt reports cough x 2 weeks and pain in her chest. She was prescribed Augmentin on March 25 but did not complete Rx. She reports non-productive cough, chest pain, Denies fever. Pt reports "feeling nervous" after seeing news on TV

## 2018-10-13 NOTE — ED Notes (Signed)
ED Provider at bedside. 

## 2018-10-13 NOTE — ED Notes (Signed)
Pt ambulated to restroom without difficulty

## 2018-10-15 ENCOUNTER — Encounter: Payer: Self-pay | Admitting: Family Medicine

## 2018-10-15 ENCOUNTER — Ambulatory Visit (INDEPENDENT_AMBULATORY_CARE_PROVIDER_SITE_OTHER): Payer: Self-pay | Admitting: Family Medicine

## 2018-10-15 ENCOUNTER — Other Ambulatory Visit: Payer: Self-pay

## 2018-10-15 DIAGNOSIS — F4322 Adjustment disorder with anxiety: Secondary | ICD-10-CM

## 2018-10-15 DIAGNOSIS — F410 Panic disorder [episodic paroxysmal anxiety] without agoraphobia: Secondary | ICD-10-CM

## 2018-10-15 MED ORDER — HYDROXYZINE HCL 25 MG PO TABS: 25.0000 mg | ORAL_TABLET | Freq: Three times a day (TID) | ORAL | 0 refills | Status: DC | PRN

## 2018-10-15 NOTE — Progress Notes (Signed)
Virtual Visit via telephone Note  I connected with patient on 10/15/18 at 1022 by telephone and verified that I am speaking with the correct person using two identifiers. Jeanette Cooper is currently located at home and patient is currently with her during visit. The provider, Rutherford Guys, MD is located in their office at time of visit.  I discussed the limitations, risks, security and privacy concerns of performing an evaluation and management service by telephone and the availability of in person appointments. I also discussed with the patient that there may be a patient responsible charge related to this service. The patient expressed understanding and agreed to proceed.   Telephone visit today for followup on anxiety  HPI  Last OV September 19 2018 Started Sertaline 25mg  a week ago Tolerating well but no sign improvement Seen in ER 10/03/2018 for SOB workup normal Denies SI  Fall Risk  09/19/2018 04/05/2016  Falls in the past year? 0 No  Follow up Falls evaluation completed -     Depression screen [facility] 2/9 09/19/2018 04/05/2016  Decreased Interest 1 1  Down, Depressed, Hopeless 1 1  PHQ - 2 Score 2 2  Altered sleeping 1 3  Tired, decreased energy 1 3  Change in appetite 1 3  Feeling bad or failure about yourself  1 1  Trouble concentrating 0 0  Moving slowly or fidgety/restless 0 0  Suicidal thoughts 0 0  PHQ-9 Score 6 12  Difficult doing work/chores - Not difficult at all    Allergies  Allergen Reactions  . Hydrocodone     Prior to Admission medications   Medication Sig Start Date End Date Taking? Authorizing Provider  amoxicillin-clavulanate (AUGMENTIN) 875-125 MG tablet Take 1 tablet by mouth 2 (two) times daily. 09/19/18   Rutherford Guys, MD  Calcium Carbonate-Vit D-Min (CALCIUM 1200 PO) Take by mouth.    [provider]  sertraline (ZOLOFT) 50 MG tablet Take 1 tablet (50 mg total) by mouth daily. 09/19/18   Rutherford Guys, MD    Past Medical  History:  Diagnosis Date  . Fibroid     Past Surgical History:  Procedure Laterality Date  . ABDOMINAL HYSTERECTOMY     abdominal hysterectomy with ovarian preservation   . ANKLE FRACTURE SURGERY    . FOOT SURGERY Right   . KNEE SURGERY Left    X2    Social History   Tobacco Use  . Smoking status: Never Smoker  . Smokeless tobacco: Never Used  Substance Use Topics  . Alcohol use: No    Family History  Problem Relation Age of Onset  . Cancer Mother        stomach  . Diabetes Paternal Grandmother     ROS Per hpi  Objective  Vitals as reported by the patient: none  There were no vitals filed for this visit.  ASSESSMENT and PLAN  1. Adjustment disorder with anxious mood 2. Panic attacks Not controlled Increase sertaline to 50mg  once a day.  Vistaril prn for panic attacks  Other orders - hydrOXYzine (ATARAX/VISTARIL) 25 MG tablet; Take 1-2 tablets (25-50 mg total) by mouth every 8 (eight) hours as needed for anxiety  FOLLOW-UP: 2 weeks   The above assessment and management plan was discussed with the patient. The patient verbalized understanding of and has agreed to the management plan. Patient is aware to call the clinic if symptoms persist or worsen. Patient is aware when to return to the clinic for a follow-up visit.  Patient educated on when it is appropriate to go to the emergency department.    I provided 12 minutes of non-face-to-face time during this encounter.  Rutherford Guys, MD Primary Care at [place], [place] [address] Ph.  [phone] Fax [phone]

## 2018-10-29 ENCOUNTER — Other Ambulatory Visit: Payer: Self-pay

## 2018-10-29 ENCOUNTER — Telehealth (INDEPENDENT_AMBULATORY_CARE_PROVIDER_SITE_OTHER): Payer: Self-pay | Admitting: Family Medicine

## 2018-10-29 DIAGNOSIS — F4322 Adjustment disorder with anxiety: Secondary | ICD-10-CM

## 2018-10-29 DIAGNOSIS — F410 Panic disorder [episodic paroxysmal anxiety] without agoraphobia: Secondary | ICD-10-CM

## 2018-10-29 MED ORDER — SERTRALINE HCL 25 MG PO TABS: 25.0000 mg | ORAL_TABLET | Freq: Every day | ORAL | 5 refills | Status: DC

## 2018-10-29 NOTE — Progress Notes (Signed)
Following up on menopause, says everything is well since taking the zoloft.No refills needed at this time

## 2018-10-29 NOTE — Progress Notes (Signed)
   Virtual Visit Note  I connected with patient on 10/29/18 at 1122am  by phone and verified that I am speaking with the correct person using two identifiers. Jeanette Cooper is currently located at home and patient is currently with them during visit. The provider, Rutherford Guys, MD is located in their office at time of visit.  I discussed the limitations, risks, security and privacy concerns of performing an evaluation and management service by telephone and the availability of in person appointments. I also discussed with the patient that there may be a patient responsible charge related to this service. The patient expressed understanding and agreed to proceed.   CC: followup adjustment, panic attacks  HPI ? Last OV 2 weeks ago Increased sertraline to 50mg  but she did not feel well at 50mg  so she went back down to 25mg  Has not needed vistaril, denies any more panic attacks She today reports that she has been doing really well  She is back to smiling, enjoying being with her family, not crying, not spending all day lying down in bed  Allergies  Allergen Reactions  . Hydrocodone     Prior to Admission medications   Medication Sig Start Date End Date Taking? Authorizing Provider  Calcium Carbonate-Vit D-Min (CALCIUM 1200 PO) Take by mouth.    [provider]  hydrOXYzine (ATARAX/VISTARIL) 25 MG tablet Take 1-2 tablets (25-50 mg total) by mouth every 8 (eight) hours as needed for anxiety. 10/15/18   Rutherford Guys, MD  sertraline (ZOLOFT) 50 MG tablet Take 1 tablet (50 mg total) by mouth daily. 09/19/18   Rutherford Guys, MD    Past Medical History:  Diagnosis Date  . Fibroid     Past Surgical History:  Procedure Laterality Date  . ABDOMINAL HYSTERECTOMY     abdominal hysterectomy with ovarian preservation   . ANKLE FRACTURE SURGERY    . FOOT SURGERY Right   . KNEE SURGERY Left    X2    Social History   Tobacco Use  . Smoking status: Never Smoker  .  Smokeless tobacco: Never Used  Substance Use Topics  . Alcohol use: No    Family History  Problem Relation Age of Onset  . Cancer Mother        stomach  . Diabetes Paternal Grandmother     ROS Per hpi  Objective  Vitals as reported by the patient: none   ASSESSMENT and PLAN  1. Adjustment disorder with anxious mood 2. Panic attacks Controlled. Continue current regime.   Other orders - sertraline (ZOLOFT) 25 MG tablet; Take 1 tablet (25 mg total) by mouth daily.  FOLLOW-UP: 6 months   The above assessment and management plan was discussed with the patient. The patient verbalized understanding of and has agreed to the management plan. Patient is aware to call the clinic if symptoms persist or worsen. Patient is aware when to return to the clinic for a follow-up visit. Patient educated on when it is appropriate to go to the emergency department.    I provided 10 minutes of non-face-to-face time during this encounter.  Rutherford Guys, MD Primary Care at [place], [place] [address] Ph.  [phone] Fax [phone]

## 2019-03-14 ENCOUNTER — Other Ambulatory Visit (HOSPITAL_COMMUNITY): Payer: Self-pay | Admitting: *Deleted

## 2019-03-14 DIAGNOSIS — N644 Mastodynia: Secondary | ICD-10-CM

## 2019-03-20 ENCOUNTER — Encounter: Payer: Self-pay | Admitting: Gynecology

## 2019-04-11 ENCOUNTER — Ambulatory Visit (HOSPITAL_COMMUNITY): Payer: Self-pay

## 2019-04-11 ENCOUNTER — Other Ambulatory Visit: Payer: Self-pay

## 2019-05-02 ENCOUNTER — Ambulatory Visit: Payer: Self-pay | Admitting: Family Medicine

## 2019-05-03 ENCOUNTER — Encounter: Payer: Self-pay | Admitting: Family Medicine

## 2019-08-29 ENCOUNTER — Other Ambulatory Visit: Payer: Self-pay

## 2019-08-29 ENCOUNTER — Encounter: Payer: Self-pay | Admitting: Family Medicine

## 2019-08-29 ENCOUNTER — Ambulatory Visit (INDEPENDENT_AMBULATORY_CARE_PROVIDER_SITE_OTHER): Payer: Self-pay | Admitting: Family Medicine

## 2019-08-29 VITALS — BP 113/74 | HR 78 | Temp 98.1°F | Ht 63.0 in | Wt 172.2 lb

## 2019-08-29 DIAGNOSIS — Z0000 Encounter for general adult medical examination without abnormal findings: Secondary | ICD-10-CM

## 2019-08-29 DIAGNOSIS — E559 Vitamin D deficiency, unspecified: Secondary | ICD-10-CM

## 2019-08-29 DIAGNOSIS — R7303 Prediabetes: Secondary | ICD-10-CM

## 2019-08-29 DIAGNOSIS — Z1211 Encounter for screening for malignant neoplasm of colon: Secondary | ICD-10-CM

## 2019-08-29 DIAGNOSIS — R195 Other fecal abnormalities: Secondary | ICD-10-CM

## 2019-08-29 DIAGNOSIS — Z1231 Encounter for screening mammogram for malignant neoplasm of breast: Secondary | ICD-10-CM

## 2019-08-29 NOTE — Progress Notes (Signed)
3/4/20212:21 PM  Jeanette Cooper 1966/06/13, 54 y.o., female [id number]  Chief Complaint  Patient presents with  . Annual Exam    HPI:   Patient is a 54 y.o. female  who presents today for WWE  Cervical Cancer Screening: last pap 2017 with neg HPV , s/p hyst for fibroids, ovaries remain, denies h/o abnormal pap Breast Cancer Screening: 2017 Colorectal Cancer Screening: due Bone Density Testing: at age 35 Seasonal Influenza Vaccination: declines Td/Tdap Vaccination: declines Pneumococcal Vaccination: at age 63 Zoster Vaccination: declines Frequency of Dental evaluation: Q6 months Frequency of Eye evaluation: denies any concern Does most preventive care in [place]  Takes calcium with vitamin D twice a day   Hearing Screening   125Hz  250Hz  500Hz  1000Hz  2000Hz  3000Hz  4000Hz  6000Hz  8000Hz   Right ear:           Left ear:             Visual Acuity Screening   Right eye Left eye Both eyes  Without correction: 20/25 20/20 20/25   With correction:         Depression screen [facility] 2/9 08/29/2019 10/29/2018 09/19/2018  Decreased Interest 0 0 1  Down, Depressed, Hopeless 0 0 1  PHQ - 2 Score 0 0 2  Altered sleeping - - 1  Tired, decreased energy - - 1  Change in appetite - - 1  Feeling bad or failure about yourself  - - 1  Trouble concentrating - - 0  Moving slowly or fidgety/restless - - 0  Suicidal thoughts - - 0  PHQ-9 Score - - 6  Difficult doing work/chores - - -    Fall Risk  08/29/2019 10/29/2018 09/19/2018 04/05/2016  Falls in the past year? 0 0 0 No  Number falls in past yr: 0 0 - -  Injury with Fall? 0 0 - -  Follow up - - Falls evaluation completed -     Allergies  Allergen Reactions  . Hydrocodone     Prior to Admission medications   Medication Sig Start Date End Date Taking? Authorizing Provider  Calcium Carbonate-Vit D-Min (CALCIUM 1200 PO) Take by mouth.   Yes [provider]    Past Medical History:  Diagnosis Date  . Fibroid     Past  Surgical History:  Procedure Laterality Date  . ABDOMINAL HYSTERECTOMY     abdominal hysterectomy with ovarian preservation   . ANKLE FRACTURE SURGERY    . FOOT SURGERY Right   . KNEE SURGERY Left    X2    Social History   Tobacco Use  . Smoking status: Never Smoker  . Smokeless tobacco: Never Used  Substance Use Topics  . Alcohol use: No    Family History  Problem Relation Age of Onset  . Cancer Mother        stomach  . Diabetes Paternal Grandmother   . Healthy Father   . Healthy Sister   . Healthy Brother   . Healthy Daughter   . Healthy Son     Review of Systems  Constitutional: Negative for chills and fever.  Respiratory: Negative for cough and shortness of breath.   Cardiovascular: Negative for chest pain, palpitations and leg swelling.  Gastrointestinal: Negative for abdominal pain, blood in stool, constipation, diarrhea, melena, nausea and vomiting.  Genitourinary: Negative for dysuria and hematuria.  All other systems reviewed and are negative.    OBJECTIVE:  Today's Vitals   08/29/19 1355  BP: 113/74  Pulse: 78  Temp:  98.1 F (36.7 C)  SpO2: 96%  Weight: 172 lb 3.2 oz (78.1 kg)  Height: 5\' 3"  (1.6 m)   Body mass index is 30.5 kg/m.   Physical Exam Vitals and nursing note reviewed.  Constitutional:      Appearance: She is well-developed.  HENT:     Head: Normocephalic and atraumatic.     Right Ear: Hearing, tympanic membrane, ear canal and external ear normal.     Left Ear: Hearing, tympanic membrane, ear canal and external ear normal.     Mouth/Throat:     Mouth: Mucous membranes are moist.     Pharynx: No oropharyngeal exudate or posterior oropharyngeal erythema.  Eyes:     Extraocular Movements: Extraocular movements intact.     Conjunctiva/sclera: Conjunctivae normal.     Pupils: Pupils are equal, round, and reactive to light.  Neck:     Thyroid: No thyromegaly.  Cardiovascular:     Rate and Rhythm: Normal rate and regular rhythm.      Heart sounds: Normal heart sounds. No murmur. No friction rub. No gallop.   Pulmonary:     Effort: Pulmonary effort is normal.     Breath sounds: Normal breath sounds. No wheezing, rhonchi or rales.  Abdominal:     General: Bowel sounds are normal. There is no distension.     Palpations: Abdomen is soft. There is no hepatomegaly, splenomegaly or mass.     Tenderness: There is no abdominal tenderness.  Musculoskeletal:        General: Normal range of motion.     Cervical back: Neck supple.     Right lower leg: No edema.     Left lower leg: No edema.  Lymphadenopathy:     Cervical: No cervical adenopathy.  Skin:    General: Skin is warm and dry.  Neurological:     Mental Status: She is alert and oriented to person, place, and time.     Cranial Nerves: No cranial nerve deficit.     Gait: Gait normal.     Deep Tendon Reflexes: Reflexes are normal and symmetric.  Psychiatric:        Mood and Affect: Mood normal.        Behavior: Behavior normal.     No results found for this or any previous visit (from the past 24 hour(s)).  No results found.   ASSESSMENT and PLAN  1. Annual physical exam No concerns per history or exam. Routine HCM labs ordered. HCM reviewed/discussed. Anticipatory guidance regarding healthy weight, lifestyle and choices given.   2. Visit for screening mammogram - Ambulatory Referral to [facility]  3. Colon cancer screening - IFOBT POC (occult bld, rslt in office); Future  4. Prediabetes Discussed LFM - Lipid panel - Hemoglobin A1c  5. Vitamin D deficiency Checking labs today, medications will be adjusted as needed.  - Vitamin D, 25-hydroxy  Return in about 6 months (around 02/29/2020) for pre-diabetes.    Rutherford Guys, MD Primary Care at [place],  [address] Ph.  [phone] Fax [phone]

## 2019-08-29 NOTE — Patient Instructions (Addendum)
Breast and Cervical Cancer Program for [facility]: 1. [place], Coordinator, [phone] 2. [place] [facility], Randel Pigg, Cordinator, [phone]    Cuidados preventivos en las mujeres de 21 a 27 aos de edad Preventive Care 47-53 Years Old, Female Los cuidados preventivos hacen referencia a las opciones en cuanto a las visitas al mdico y al estilo de vida, las cuales pueden promover la salud y Musician. Esto puede comprender lo siguiente:  Un examen fsico anual. Esto tambin se puede llamar control de bienestar anual.  Visitas regulares al dentista y exmenes oculares.  Vacunas.  Estudios para Engineer, building services.  Opciones saludables de estilo de vida, como seguir una dieta saludable, hacer ejercicio regularmente, no usar drogas ni productos que contengan nicotina y tabaco, y limitar el consumo de alcohol. Qu puedo esperar para mi visita de cuidado preventivo? Examen fsico El mdico revisar lo siguiente:  IT consultant y [place]. Esto se puede usar para calcular el ndice de masa corporal ([place]), que indica si tiene un peso saludable.  Frecuencia cardaca y presin arterial.  Piel para detectar manchas anormales. Asesoramiento Su mdico puede preguntarle acerca de:  Consumo de tabaco, alcohol y drogas.  Su bienestar emocional.  El bienestar en el hogar y sus relaciones personales.  Su actividad sexual.  Sus hbitos de alimentacin.  Su trabajo y [place] laboral.  Mtodos anticonceptivos.  Su ciclo menstrual.  Sus antecedentes de Media planner. Qu vacunas necesito?  [place] antigripal  Se recomienda aplicarse esta vacuna todos los [place]. Vacuna contra el ttanos, difteria y tos ferina (Tdap)  Es posible que tenga que aplicarse un refuerzo contra el ttanos y la difteria (DT) cada 10aos. Vacuna contra la varicela  Es posible que tenga que aplicrsela si no recibi esta vacuna. Vacuna contra el herpes zster  (culebrilla)  Es posible que la necesite despus de los 31 aos de [place]. Vacuna contra el sarampin, rubola y paperas (SRP)  Es posible que necesite aplicarse al menos una dosis de la vacuna SRP si naci despus de [phone]. Podra tambin necesitar una segunda dosis. Vacuna antineumoccica conjugada (PCV13)  Puede necesitar esta vacuna si tiene determinadas enfermedades y no se vacun anteriormente. Edward Jolly antineumoccica de polisacridos (PPSV23)  Quizs tenga que aplicarse una o dos dosis si fuma o si tiene determinadas afecciones. Edward Jolly antimeningoccica conjugada (MenACWY)  Puede necesitar esta vacuna si tiene determinadas afecciones. Vacuna contra la hepatitis A  Es posible que necesite esta vacuna si tiene ciertas afecciones o si viaja o trabaja en lugares en los que podra estar expuesta a la hepatitis A. Vacuna contra la hepatitis B  Es posible que necesite esta vacuna si tiene ciertas afecciones o si viaja o trabaja en lugares en los que podra estar expuesta a la hepatitis B. Vacuna antihaemophilus influenzae tipo B (Hib)  Puede necesitar esta vacuna si tiene determinadas afecciones. Vacuna contra el virus del papiloma humano (VPH)  Si el mdico se lo recomienda, Research scientist (physical sciences) tres dosis a lo largo de 6 meses. Puede recibir las vacunas en forma de dosis individuales o en forma de dos o ms vacunas juntas en la misma inyeccin (vacunas combinadas). Hable con su mdico [employer] y beneficios de las vacunas combinadas. Qu pruebas necesito? Anlisis de [employer] de lpidos y colesterol. Estos se pueden verificar cada 5 aos o, con ms frecuencia, si usted tiene ms de 52 aos de edad.  Anlisis de hepatitisC.  Anlisis de hepatitisB. Pruebas de deteccin  Pruebas de deteccin de cncer  de pulmn. Es posible que se le realice esta prueba de deteccin a partir de los 84 aos de edad, si ha fumado durante 30 aos un paquete diario y sigue fumando o dej el  hbito en algn momento en los ltimos 15 aos.  Pruebas de Programme researcher, broadcasting/film/video de Surveyor, minerals. Todos los adultos a partir de los 69 aos de edad y [place] 28 aos de edad deben hacerse esta prueba de deteccin. El mdico puede recomendarle las pruebas de deteccin a partir de los 50 aos de edad si corre un mayor riesgo. Le realizarn pruebas cada 1 a 10 aos, segn los [place] y el tipo de prueba de Programme researcher, broadcasting/film/video.  Pruebas de deteccin de la diabetes. Esto se Set designer un control del azcar en la sangre (glucosa) despus de no haber comido durante un periodo de tiempo (ayuno). Es posible que se le realice esta prueba cada 1 a 3 aos.  Mamografa. Se puede realizar cada 1 o 2 aos. Hable con su mdico sobre cundo debe comenzar a Engineer, manufacturing de [place] regular. Esto depende de si tiene antecedentes familiares de cncer de mama o no.  Pruebas de deteccin de cncer relacionado con las mutaciones del BRCA. Es posible que se las deba realizar si tiene antecedentes de cncer de mama, de ovario, de trompas o peritoneal.  Examen plvico y prueba de Papanicolaou. Esto se puede realizar cada 44aos a Renato Gails de los 21aos de edad. A partir de los 30 aos, esto se puede Optometrist cada 5 aos si usted se realiza una prueba de Papanicolaou en combinacin con una prueba de deteccin del virus del papiloma humano (VPH). Otras pruebas  Anlisis de enfermedades de transmisin sexual (ETS).  Densitometra sea. Esto se realiza para detectar osteoporosis. Se le puede realizar este examen de deteccin si tiene un riesgo alto de tener osteoporosis. Siga estas instrucciones en su casa: Comida y bebida  Siga una dieta que incluya frutas frescas y verduras, cereales integrales, lcteos descremados y protenas magras.  Tome los suplementos vitamnicos y [employer] se lo haya indicado el mdico.  No beba alcohol si: ? Su mdico le indica no hacerlo. ? Est embarazada, puede estar embarazada o  est tratando de quedar embarazada.  Si bebe alcohol: ? Limite la cantidad que consume de 0 a 1 medida por da. ? Est atenta a la cantidad de alcohol que hay en las bebidas que toma. En los [place], una medida equivale a una botella de cerveza de 12oz (337m), un vaso de vino de 5oz (1442m o un vaso de una bebida alcohlica de alta graduacin de 1oz (4471m Estilo de vidNavistar International Corporationlas encas a diario.  Mantngase activa. Haga al menos 64m28mos de ejercicio 5o ms das Hilton Hotelso consuma ningn producto que contenga nicotina o tabaco, como cigarrillos, cigarrillos electrnicos y tabaco de mascHigher education careers adviser necesita ayuda para dejar de fumar, consulte al mdico.  Si es sexualmente activa, practique sexo seguro. Use un condn u otra forma de mtodo anticonceptivo (anticonceptivos) a fin de evitEnvironmental health practitionerTS (infecciones de transmisin sexual).  Si el mdico se lo indic, tome una dosis baja de aspirina diariamente a partir de los 50 a27 de edadNorth [place] volver?  Visite al mdico una vez al ao para una visita de control.  Pregntele al mdico con qu frecuencia debe realizarse un control de la vista y los dientes.  Mantenga su esquema de vacunacin al da. Esta informacin no tiene comoEnergy Transfer Partners  fin reemplazar el consejo del mdico. Asegrese de hacerle al mdico cualquier pregunta que tenga. Document Revised: 03/29/2018 Document Reviewed: 03/29/2018 Elsevier Patient Education  [place] [employer].    If you have lab work done today you will be contacted with your lab results within the next 2 weeks.  If you have not heard from [place] then please contact us. The fastest way to get your results is to register for My Chart.   IF you received an x-ray today, you will receive an invoice from [facility]. Please contact [facility] at [phone] with questions or concerns regarding your invoice.   IF you received labwork today, you will receive  an invoice from [place]. Please contact [facility] at [phone] with questions or concerns regarding your invoice.   Our billing staff will not be able to assist you with questions regarding bills from these companies.  You will be contacted with the lab results as soon as they are available. The fastest way to get your results is to activate your My Chart account. Instructions are located on the last page of this paperwork. If you have not heard from [place] regarding the results in 2 weeks, please contact this office.

## 2019-08-30 LAB — LIPID PANEL
Chol/HDL Ratio: 4.4 ratio (ref 0.0–4.4)
Cholesterol, Total: 204 mg/dL — ABNORMAL HIGH (ref 100–199)
HDL: 46 mg/dL (ref >39)
LDL Chol Calc (NIH): 122 mg/dL — ABNORMAL HIGH (ref 0–99)
Triglycerides: 204 mg/dL — ABNORMAL HIGH (ref 0–149)
VLDL Cholesterol Cal: 36 mg/dL (ref 5–40)

## 2019-08-30 LAB — HEMOGLOBIN A1C
Est. average glucose Bld gHb Est-mCnc: 111 mg/dL
Hgb A1c MFr Bld: 5.5 % (ref 4.8–5.6)

## 2019-08-30 LAB — VITAMIN D 25 HYDROXY (VIT D DEFICIENCY, FRACTURES): Vit D, 25-Hydroxy: 20.2 ng/mL — ABNORMAL LOW (ref 30.0–100.0)

## 2019-09-05 LAB — IFOBT (OCCULT BLOOD): IFOBT: POSITIVE

## 2019-09-05 NOTE — Addendum Note (Signed)
Addended by: Donnelly Stager on: 09/05/2019 05:05 PM   Modules accepted: Orders

## 2019-09-11 ENCOUNTER — Telehealth: Payer: Self-pay

## 2019-09-11 NOTE — Telephone Encounter (Signed)
-----   Message from Rutherford Guys, MD sent at 09/10/2019  1:09 PM EDT ----- Please call patient and let her know  Her hemoglobin A1c is normal. She does not have diabetes. Her cholesterol is mildly elevated. I recommend a diet low on fried or greasy foods and red meat.  Her vitamin D is slightly low. I recommend to increase her daily vitamin D to 3000 units a day.  Thanks

## 2019-09-11 NOTE — Telephone Encounter (Signed)
Patient has been informed per drs result notes and recommendations. She verbalizes understanding. Please advice next steps for positive IFOBT occult blood  test results.

## 2019-09-12 ENCOUNTER — Telehealth: Payer: Self-pay

## 2019-09-12 NOTE — Addendum Note (Signed)
Addended by: Rutherford Guys on: 09/12/2019 09:09 AM   Modules accepted: Orders

## 2019-09-12 NOTE — Telephone Encounter (Signed)
Please let patient know that I have made a referral to GI as she will need a colonoscopy given her positive colon cancer screening. thanks

## 2019-09-12 NOTE — Telephone Encounter (Signed)
Patient informed per drs result notes and recommendations for referral to GI positive poc occult bld screen.

## 2019-10-10 ENCOUNTER — Encounter: Payer: Self-pay | Admitting: Family Medicine

## 2019-10-17 ENCOUNTER — Ambulatory Visit: Payer: Self-pay | Admitting: Medical

## 2019-10-17 ENCOUNTER — Ambulatory Visit: Payer: Self-pay

## 2019-10-17 ENCOUNTER — Other Ambulatory Visit: Payer: Self-pay

## 2019-10-17 ENCOUNTER — Encounter: Payer: Self-pay | Admitting: Medical

## 2019-10-17 ENCOUNTER — Ambulatory Visit
Admission: RE | Admit: 2019-10-17 | Discharge: 2019-10-17 | Disposition: A | Discharge status: Home / Self Care | Payer: No Typology Code available for payment source | Source: Ambulatory Visit | Attending: Obstetrics and Gynecology | Admitting: Obstetrics and Gynecology

## 2019-10-17 VITALS — BP 128/76 | Temp 97.3°F | Wt 174.0 lb

## 2019-10-17 DIAGNOSIS — Z1239 Encounter for other screening for malignant neoplasm of breast: Secondary | ICD-10-CM

## 2019-10-17 DIAGNOSIS — N644 Mastodynia: Secondary | ICD-10-CM

## 2019-10-17 DIAGNOSIS — N951 Menopausal and female climacteric states: Secondary | ICD-10-CM

## 2019-10-17 NOTE — Patient Instructions (Signed)
Mamografa Mammogram [employer] es un radiografa de las mamas que se realiza para determinar si hay cambios que no son normales. Este estudio permite explorar y Pension scheme manager cualquier cambio que pudiera sugerir la presencia de cncer de mama. Las Merck & Co se Research scientist (medical) en las mujeres. Un hombre puede hacerse una mamografa si tiene un bulto o hinchazn en la mama. Tambin puede ayudar a identificar otros cambios y variaciones en las mamas. Informe al mdico:  Acerca de cualquier alergia que tenga.  Si tiene implantes mamarios.  Si ha tenido enfermedades, biopsias o cirugas previas de Scientist, research (medical).  Si est amamantando.  Si es menor de 25aos.  Si tiene antecedentes familiares de cncer de mama.  Si est embarazada o podra estarlo. Cules son los riesgos? En general, se trata de un procedimiento seguro. Sin embargo, pueden ocurrir complicaciones, por ejemplo:  Exposicin a la radiacin. En [employer], los [place] de radiacin son muy bajos.  La interpretacin [employer].  La necesidad de Optometrist ms [employer].  La imposibilidad de la mamografa de Hydrographic surveyor algunos tipos de cncer. Qu ocurre antes del procedimiento?  Hgase este estudio aproximadamente 1 o 2semanas despus de la [place]. Generalmente, este es el momento en que las mamas estn menos sensibles.  Si consulta a un mdico nuevo o cambia de Chief of Staff, enve las Merck & Co anteriores al [employer].  El da del estudio, lvese las [place] y [place].  No use desodorantes, perfumes, lociones o talcos el da del estudio.  Qutese las alhajas del cuello.  Use prendas que pueda ponerse y sacarse fcilmente. Qu ocurre durante el procedimiento?   Se quitar la ropa de la cintura para [place]. Se colocar una bata.  Debe permanecer de pie delante de la mquina de rayos-X.  Se colocar cada mama entre dos placas de [place] unos segundos. Intente estar lo ms relajada posible. Esto no causa ningn dao a las mamas. Si siente alguna molestia, ser pasajera.  Se tomarn radiografas desde diferentes ngulos de cada mama. Este procedimiento puede variar segn el mdico y el hospital. Sander Nephew ocurre despus del procedimiento?  Ron Parker ser leda por un especialista (radilogo).  Tal vez deba repetir [employer] del estudio. Esto depende de la calidad de las imgenes.  Pregunte la fecha en que los resultados estarn disponibles. Asegrese de The TJX Companies.  Puede volver a sus actividades habituales. Resumen  Lavinia Sharps es un radiografa de baja energa de las mamas que se realiza para determinar si hay cambios anormales. Un hombre puede [employer]gate examen si tiene un bulto o hinchazn en la mama.  Antes del procedimiento, informe al mdico sobre cualquier problema en las mamas que haya tenido en el pasado.  Hgase este estudio aproximadamente 1 o 2semanas despus de la [place].  Para el examen, se colocar cada mama entre dos placas de [place] unos segundos.  Ron Parker ser leda por un especialista (radilogo). Pregunte la fecha en que los resultados estarn disponibles. Asegrese de The [employer]JX Companies. Esta informacin no tiene Marine scientist el consejo del mdico. Asegrese de hacerle al mdico cualquier pregunta que tenga. Document Revised: 03/13/2018 Document Reviewed: 03/13/2018 Elsevier Patient Education  [place].

## 2019-10-17 NOTE — Progress Notes (Signed)
[name] is a 54 y.o. female who presents to [facility] clinic today with complaint of breast tenderness bilaterally that has resolved. Patient also complains of vaginal dryness and pain with intercourse.    Pap Smear: Pap not smear completed today. Last Pap smear was 03/17/2016 at [facility] was normal. Per patient has no history of an abnormal Pap smear. Last Pap smear result is available in Epic.   Physical exam: Breasts Breasts symmetrical. No skin abnormalities bilateral breasts. No nipple retraction bilateral breasts. No nipple discharge bilateral breasts. No lymphadenopathy. No lumps palpated bilateral breasts.       Pelvic/Bimanual Pap is not indicated today. Patient had hysterectomy 18 years ago in [place].    Smoking History: Patient has never smoked.     Patient Navigation: Patient education provided. Access to services provided for patient through [facility] program. Interpreter provided.    Colorectal Cancer Screening: Per patient has never had colonoscopy completed No complaints today.    Breast and Cervical Cancer Risk Assessment: Patient does not have family history of breast cancer, known genetic mutations, or radiation treatment to the chest before age 78. Patient does not have history of cervical dysplasia, immunocompromised, or DES exposure in-utero.  Risk Assessment    Risk Scores      10/17/2019   Last edited by: Demetrius Revel, LPN   5-year risk: 0.5 %   Lifetime risk: 4.3 %          A: B[facility] exam without pap smear Complaint of bilateral breast tenderness, resolved  Vaginal dryness   P: Referred patient to the [facility] for a diagnostic mammogram. Appointment scheduled 10/17/2019 @ 11am. Referred to [facility] for further evaluation  Jeanette Redden, PA-C 10/17/2019 10:08 AM

## 2019-12-09 ENCOUNTER — Encounter: Payer: No Typology Code available for payment source | Admitting: Obstetrics and Gynecology

## 2020-02-28 ENCOUNTER — Ambulatory Visit: Payer: Self-pay | Admitting: Family Medicine

## 2020-03-03 ENCOUNTER — Encounter: Payer: Self-pay | Admitting: Family Medicine

## 2020-09-18 ENCOUNTER — Emergency Department (HOSPITAL_BASED_OUTPATIENT_CLINIC_OR_DEPARTMENT_OTHER): Payer: No Typology Code available for payment source

## 2020-09-18 ENCOUNTER — Other Ambulatory Visit: Payer: Self-pay

## 2020-09-18 ENCOUNTER — Emergency Department (HOSPITAL_BASED_OUTPATIENT_CLINIC_OR_DEPARTMENT_OTHER)
Admission: EM | Admit: 2020-09-18 | Discharge: 2020-09-18 | Disposition: A | Discharge status: Home / Self Care | Payer: No Typology Code available for payment source | Attending: Emergency Medicine | Admitting: Emergency Medicine

## 2020-09-18 ENCOUNTER — Encounter (HOSPITAL_BASED_OUTPATIENT_CLINIC_OR_DEPARTMENT_OTHER): Payer: Self-pay

## 2020-09-18 DIAGNOSIS — E236 Other disorders of pituitary gland: Secondary | ICD-10-CM | POA: Insufficient documentation

## 2020-09-18 DIAGNOSIS — R519 Headache, unspecified: Secondary | ICD-10-CM | POA: Insufficient documentation

## 2020-09-18 NOTE — ED Provider Notes (Signed)
[facility] EMERGENCY DEPARTMENT Provider Note   CSN: [id number] Arrival date & time: 09/18/20  1953     History Chief Complaint  Patient presents with  . Headache    Jeanette Cooper is a 55 y.o. female.  She has no significant past medical history.  She is primarily Spanish-speaking and her daughter is translating at her request.  She has had headaches that come and go since Covid began.  She has a lot of stress.  Daughter states she worries about a lot of things.  The headaches seem to last for 10 to 15 seconds at a time and then go away.  She gets these multiple times a day at least 3 or 4 days a week.  She saw a neurologist in [place] who prescribed her something that helped but she does not know the name.  No blurry vision double vision numbness or weakness.  Daughter states the patient is worried that she has a brain tumor  The history is provided by the patient.  Headache Pain location:  Generalized Quality:  Stabbing Radiates to:  Does not radiate Severity currently:  Unable to specify Severity at highest:  Unable to specify Onset quality:  Gradual Duration: 2 years. Timing:  Intermittent Progression:  Unchanged Similar to prior headaches: yes   Context: emotional stress   Relieved by:  Nothing Ineffective treatments:  None tried Associated symptoms: no abdominal pain, no blurred vision, no fever, no focal weakness, no neck pain, no sore throat and no visual change        Past Medical History:  Diagnosis Date  . Fibroid     Patient Active Problem List   Diagnosis Date Noted  . Prediabetes 08/29/2019  . Vitamin D deficiency 08/29/2019  . Adjustment disorder with anxious mood 09/19/2018  . Panic attacks 09/19/2018  . Hot flashes 03/17/2016  . Perimenopause 03/17/2016    Past Surgical History:  Procedure Laterality Date  . ABDOMINAL HYSTERECTOMY     abdominal hysterectomy with ovarian preservation   . ANKLE FRACTURE SURGERY    . FOOT SURGERY  Right   . KNEE SURGERY Left    X2     OB History    Gravida  3   Para  3   Term      Preterm      AB      Living  3     SAB      IAB      Ectopic      Multiple      Live Births              Family History  Problem Relation Age of Onset  . Cancer Mother        stomach  . Diabetes Paternal Grandmother   . Healthy Father   . Healthy Sister   . Healthy Brother   . Healthy Daughter   . Healthy Son     Social History   Tobacco Use  . Smoking status: Never Smoker  . Smokeless tobacco: Never Used  Vaping Use  . Vaping Use: Never used  Substance Use Topics  . Alcohol use: No  . Drug use: No    Home Medications Prior to Admission medications   Medication Sig Start Date End Date Taking? Authorizing Provider  Calcium Carbonate-Vit D-Min (CALCIUM 1200 PO) Take by mouth.    [provider]    Allergies    Hydrocodone  Review of Systems   Review of  Systems  Constitutional: Negative for fever.  HENT: Negative for sore throat.   Eyes: Negative for blurred vision and visual disturbance.  Respiratory: Negative for shortness of breath.   Cardiovascular: Negative for chest pain.  Gastrointestinal: Negative for abdominal pain.  Genitourinary: Negative for dysuria.  Musculoskeletal: Negative for neck pain.  Skin: Negative for rash.  Neurological: Positive for headaches. Negative for focal weakness.    Physical Exam Updated Vital Signs BP 128/77 (BP Location: Left Arm)   Pulse 75   Temp 98.1 F (36.7 C) (Oral)   Resp 18   Ht 5\' 3"  (1.6 m)   Wt 81.6 kg   SpO2 98%   BMI 31.89 kg/m   Physical Exam Vitals and nursing note reviewed.  Constitutional:      General: She is not in acute distress.    Appearance: She is well-developed.  HENT:     Head: Normocephalic and atraumatic.  Eyes:     Conjunctiva/sclera: Conjunctivae normal.  Cardiovascular:     Rate and Rhythm: Normal rate and regular rhythm.     Heart sounds: No murmur  heard.   Pulmonary:     Effort: Pulmonary effort is normal. No respiratory distress.     Breath sounds: Normal breath sounds.  Abdominal:     Palpations: Abdomen is soft.     Tenderness: There is no abdominal tenderness.  Musculoskeletal:     Cervical back: Neck supple.  Skin:    General: Skin is warm and dry.  Neurological:     Mental Status: She is alert and oriented to person, place, and time.     Cranial Nerves: No cranial nerve deficit, dysarthria or facial asymmetry.     Sensory: No sensory deficit.     Motor: No weakness.     Gait: Gait normal.     ED Results / Procedures / Treatments   Labs (all labs ordered are listed, but only abnormal results are displayed) Labs Reviewed - No data to display  EKG None  Radiology CT Head Wo Contrast  Result Date: 09/18/2020 CLINICAL DATA:  Headaches EXAM: CT HEAD WITHOUT CONTRAST TECHNIQUE: Contiguous axial images were obtained from the base of the skull through the vertex without intravenous contrast. COMPARISON:  None. FINDINGS: Brain: Findings suggestive of empty sella are noted. No findings to suggest acute hemorrhage or infarct are noted. Vascular: No hyperdense vessel or unexpected calcification. Skull: Normal. Negative for fracture or focal lesion. Sinuses/Orbits: No acute finding. Other: None. IMPRESSION: Changes suggestive of empty sella syndrome. No other focal abnormality is noted. Electronically Signed   By: Inez Catalina M.D.   On: 09/18/2020 20:35    Procedures Procedures   Medications Ordered in ED Medications - No data to display  ED Course  I have reviewed the triage vital signs and the nursing notes.  Pertinent labs & imaging results that were available during my care of the patient were reviewed by me and considered in my medical decision making (see chart for details).    MDM Rules/Calculators/A&P                         55 year old female here with years of intermittent headache.  Normal neurologic exam.   Differential includes tension headache, tumor, migraine.  Imaging shows possible empty sella reviewed with patient.  Recommended neurology outpatient follow-up.  Patient and daughter are comfortable with plan.  Final Clinical Impression(s) / ED Diagnoses Final diagnoses:  Headache disorder  Empty sella syndrome (  [facility])    Rx / DC Orders ED Discharge Orders    None       Hayden Rasmussen, MD 09/19/20 1054

## 2020-09-18 NOTE — Discharge Instructions (Signed)
You were seen in the emergency department for evaluation of headaches abdomen going on and off for months or years.  Your CAT scan did not show an obvious explanation for your symptoms.  There were some signs of possible empty sella.  This will need to be followed up with neurology.  We have placed a referral for you.  You can use Tylenol or ibuprofen for pain.

## 2020-09-18 NOTE — ED Triage Notes (Signed)
Pt arrives to ED with c/o headaches X10 months going back and forth from one side to another. Pt was given tylenol with no relief.

## 2023-02-17 IMAGING — CT CT HEAD W/O CM
3 series · 16 of 47 positions shown, 19 images · non-contrast
Comparison: None.

CLINICAL DATA: Headaches

EXAM:
CT HEAD WITHOUT CONTRAST
TECHNIQUE: Contiguous axial images were obtained from the base of the skull
through the vertex without intravenous contrast.

[Series 2: head wo · axial · 0.44mm/px · z∈[+883,+1023]mm · 10 of 34 slices shown, 13 images]
[im 3/34  brain]
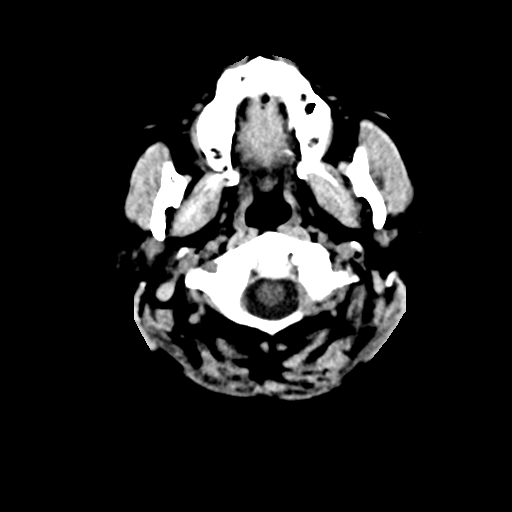
[im 3/34  bone]
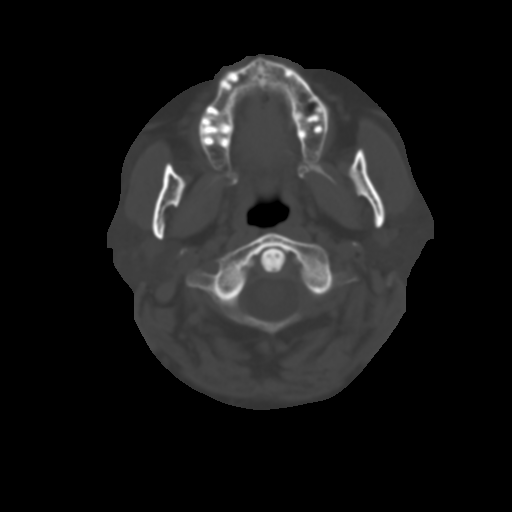
[im 6/34  brain]
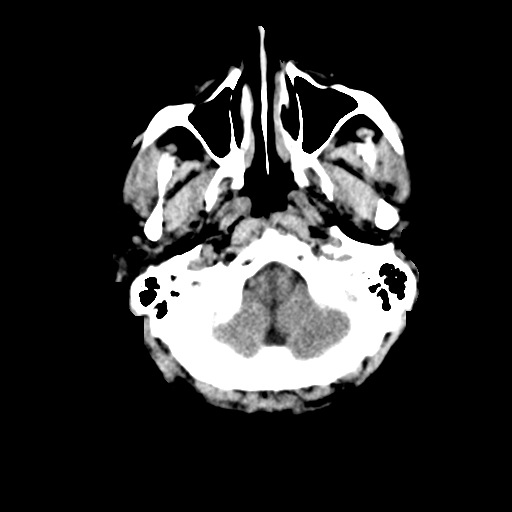
[im 10/34  brain]
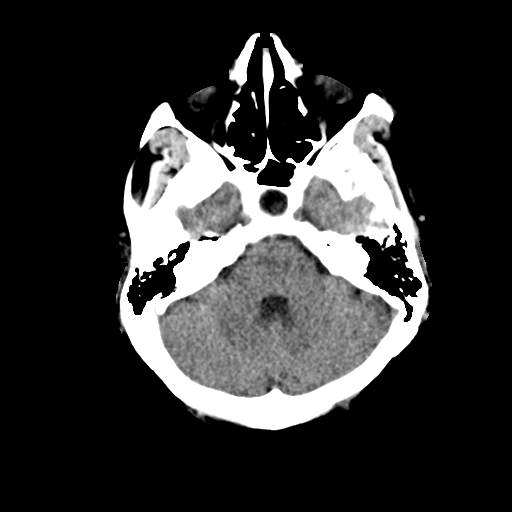
[im 12/34  brain]
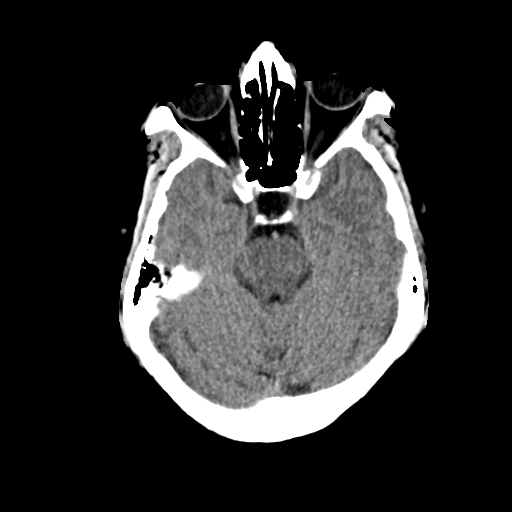
[im 15/34  brain]
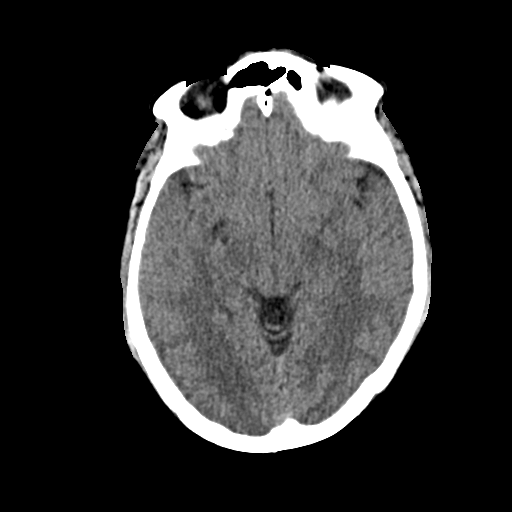
[im 15/34  bone]
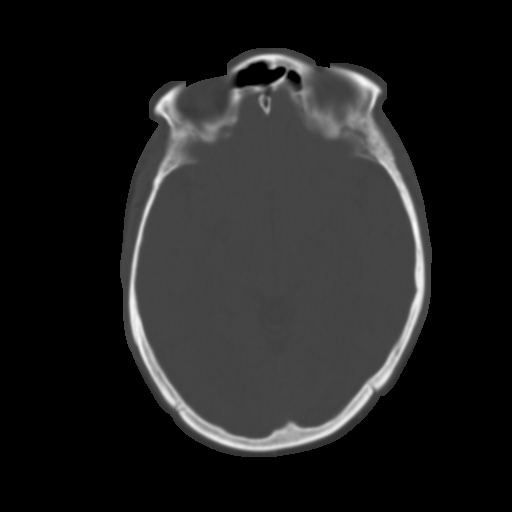
[im 19/34  brain]
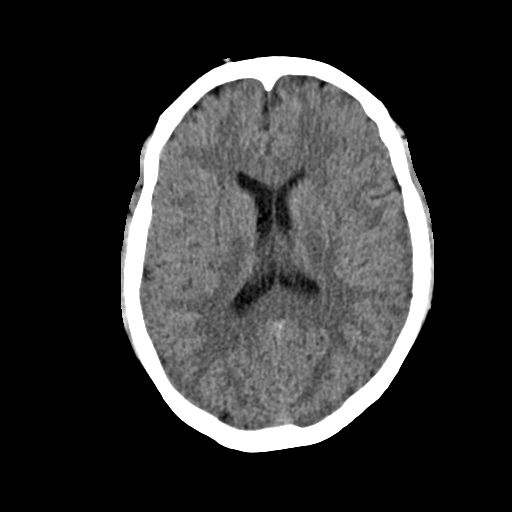
[im 22/34  brain]
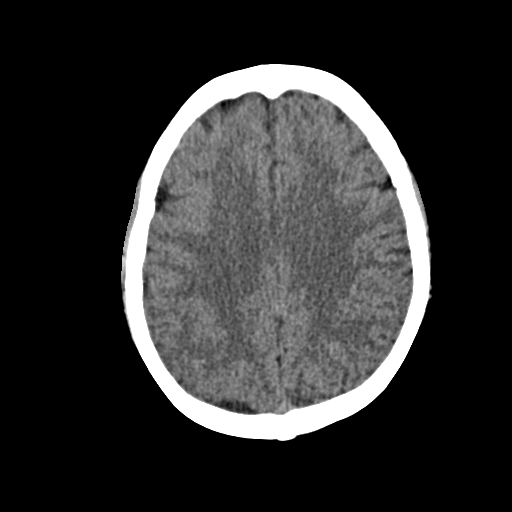
[im 26/34  brain]
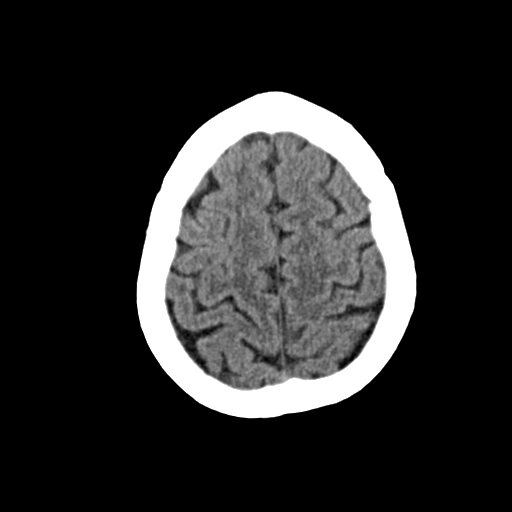
[im 28/34  brain]
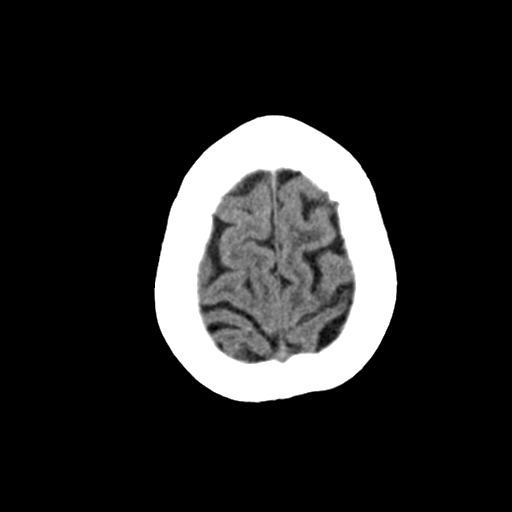
[im 28/34  bone]
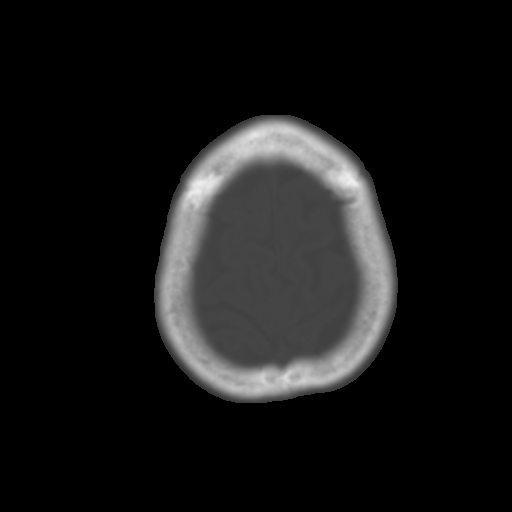
[im 31/34  brain]
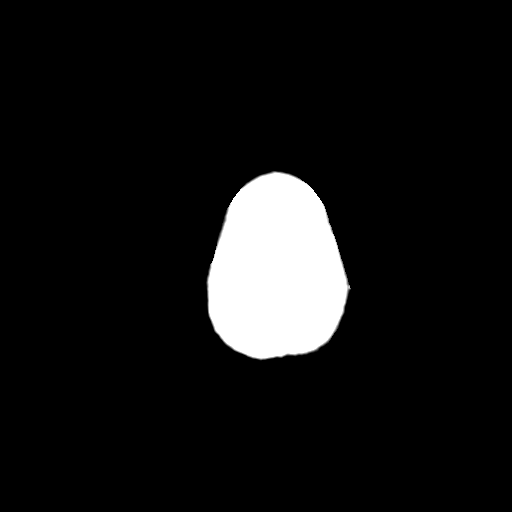

[Series 4: coronal soft · coronal · 0.32mm/px · 3 of 67 slices shown]
[im 23/67  brain]
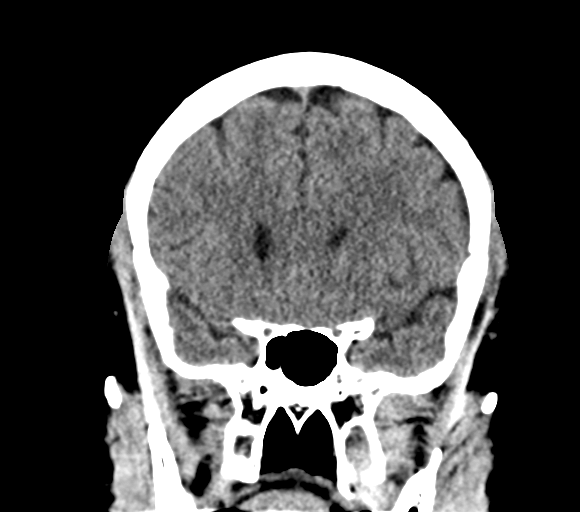
[im 30/67  brain]
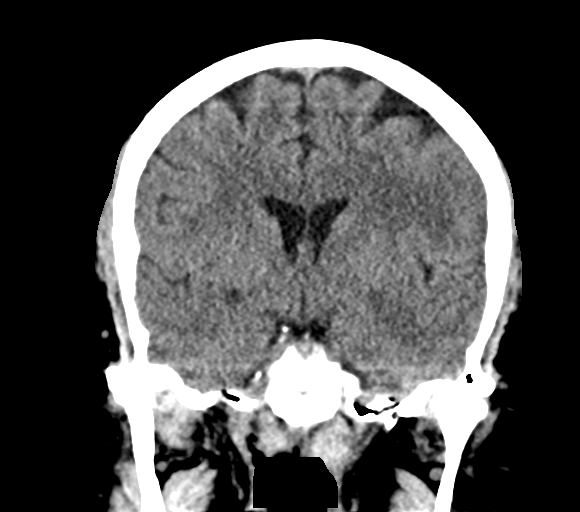
[im 37/67  brain]
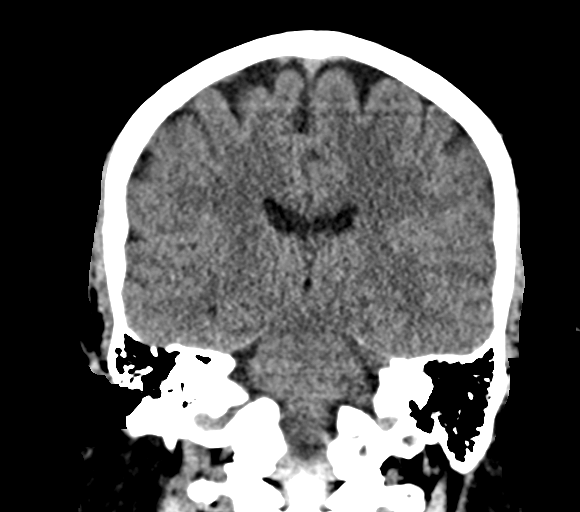

[Series 5: sag soft · sagittal · 0.33mm/px · 3 of 67 slices shown]
[im 23/67  brain]
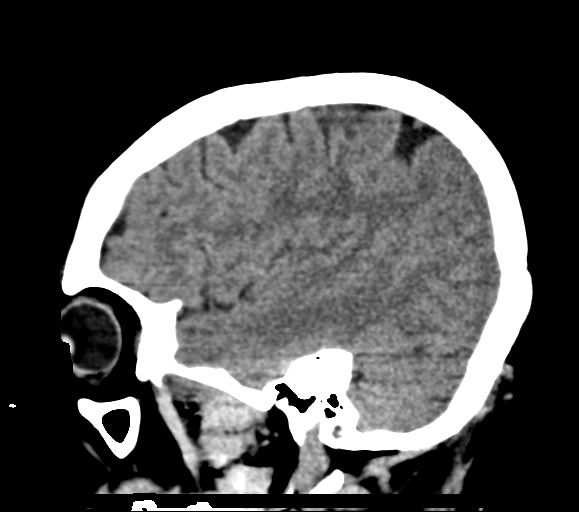
[im 34/67  brain]
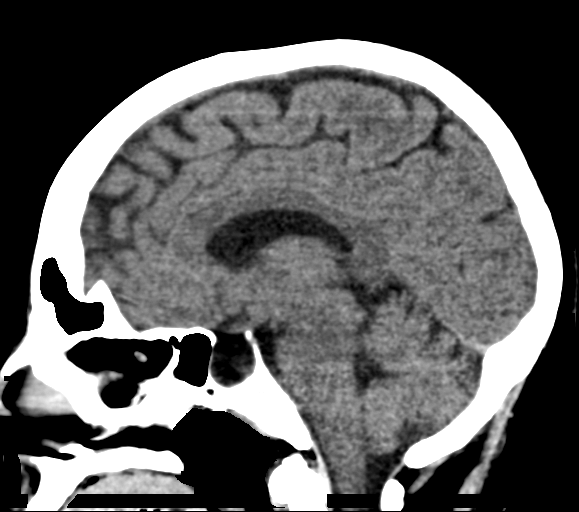
[im 45/67  brain]
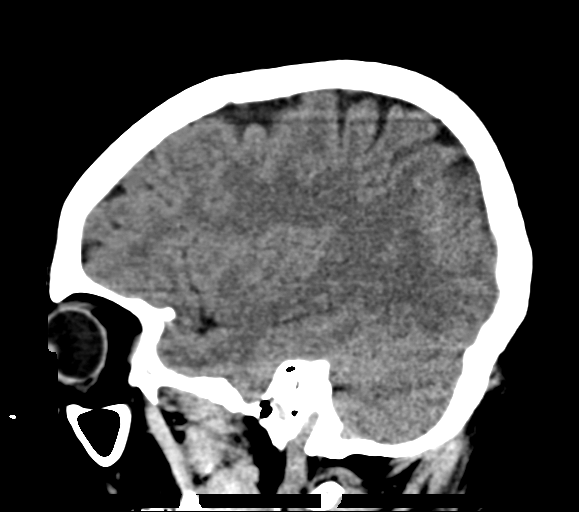

[16 of 47 positions shown; findings below may reference images not displayed]

FINDINGS: Brain: Findings suggestive of empty sella are noted. No findings to
suggest acute hemorrhage or infarct are noted.

Vascular: No hyperdense vessel or unexpected calcification.

Skull: Normal. Negative for fracture or focal lesion.

Sinuses/Orbits: No acute finding.

Other: None.
IMPRESSION: Changes suggestive of empty sella syndrome. No other focal
abnormality is noted.
